# Patient Record
Sex: Female | Born: 1950 | Race: White | Hispanic: No | Smoking: Former smoker
Health system: Southern US, Community
[De-identification: ages and names within clinical notes are randomized; demographics above are authoritative.]

## PROBLEM LIST (undated history)

## (undated) DIAGNOSIS — Z862 Personal history of diseases of the blood and blood-forming organs and certain disorders involving the immune mechanism: Secondary | ICD-10-CM

## (undated) DIAGNOSIS — E782 Mixed hyperlipidemia: Secondary | ICD-10-CM

## (undated) DIAGNOSIS — M542 Cervicalgia: Secondary | ICD-10-CM

## (undated) DIAGNOSIS — I10 Essential (primary) hypertension: Secondary | ICD-10-CM

## (undated) DIAGNOSIS — N393 Stress incontinence (female) (male): Secondary | ICD-10-CM

## (undated) DIAGNOSIS — B001 Herpesviral vesicular dermatitis: Secondary | ICD-10-CM

## (undated) DIAGNOSIS — G8929 Other chronic pain: Secondary | ICD-10-CM

## (undated) DIAGNOSIS — N8110 Cystocele, unspecified: Secondary | ICD-10-CM

## (undated) DIAGNOSIS — M8580 Other specified disorders of bone density and structure, unspecified site: Secondary | ICD-10-CM

## (undated) HISTORY — DX: Stress incontinence (female) (male): N39.3

## (undated) HISTORY — DX: Other specified disorders of bone density and structure, unspecified site: M85.80

## (undated) HISTORY — PX: BLADDER REPAIR: SHX76

## (undated) HISTORY — DX: Mixed hyperlipidemia: E78.2

## (undated) HISTORY — DX: Herpesviral vesicular dermatitis: B00.1

## (undated) HISTORY — DX: Personal history of diseases of the blood and blood-forming organs and certain disorders involving the immune mechanism: Z86.2

## (undated) HISTORY — DX: Essential (primary) hypertension: I10

## (undated) HISTORY — DX: Cervicalgia: M54.2

## (undated) HISTORY — DX: Cystocele, unspecified: N81.10

## (undated) HISTORY — DX: Other chronic pain: G89.29

## (undated) HISTORY — PX: REPAIR OF RECTAL PROLAPSE: SHX6420

---

## 2014-01-07 LAB — HM PAP SMEAR

## 2014-05-30 LAB — HM DEXA SCAN

## 2014-07-29 LAB — HM MAMMOGRAPHY

## 2015-05-22 LAB — COLOGUARD: COLOGUARD: NEGATIVE

## 2015-06-01 LAB — COLOGUARD: Cologuard: NEGATIVE

## 2015-10-23 ENCOUNTER — Encounter: Payer: Self-pay | Admitting: Family Medicine

## 2015-10-23 ENCOUNTER — Ambulatory Visit (INDEPENDENT_AMBULATORY_CARE_PROVIDER_SITE_OTHER): Payer: Medicare HMO | Admitting: Family Medicine

## 2015-10-23 VITALS — BP 124/80 | HR 63 | Ht 62.5 in | Wt 172.8 lb

## 2015-10-23 DIAGNOSIS — I10 Essential (primary) hypertension: Secondary | ICD-10-CM | POA: Diagnosis not present

## 2015-10-23 DIAGNOSIS — N393 Stress incontinence (female) (male): Secondary | ICD-10-CM | POA: Insufficient documentation

## 2015-10-23 DIAGNOSIS — B001 Herpesviral vesicular dermatitis: Secondary | ICD-10-CM | POA: Insufficient documentation

## 2015-10-23 DIAGNOSIS — Z862 Personal history of diseases of the blood and blood-forming organs and certain disorders involving the immune mechanism: Secondary | ICD-10-CM | POA: Insufficient documentation

## 2015-10-23 DIAGNOSIS — Z7689 Persons encountering health services in other specified circumstances: Secondary | ICD-10-CM | POA: Diagnosis not present

## 2015-10-23 LAB — CBC WITH DIFFERENTIAL/PLATELET
BASOS ABS: 85 {cells}/uL (ref 0–200)
Basophils Relative: 1 %
EOS ABS: 85 {cells}/uL (ref 15–500)
Eosinophils Relative: 1 %
HCT: 40.6 % (ref 35.0–45.0)
Hemoglobin: 14.2 g/dL (ref 11.7–15.5)
Lymphocytes Relative: 26 %
Lymphs Abs: 2210 cells/uL (ref 850–3900)
MCH: 30.5 pg (ref 27.0–33.0)
MCHC: 35 g/dL (ref 32.0–36.0)
MCV: 87.3 fL (ref 80.0–100.0)
MONOS PCT: 7 %
MPV: 11.8 fL (ref 7.5–12.5)
Monocytes Absolute: 595 cells/uL (ref 200–950)
NEUTROS ABS: 5525 {cells}/uL (ref 1500–7800)
NEUTROS PCT: 65 %
PLATELETS: 157 10*3/uL (ref 140–400)
RBC: 4.65 MIL/uL (ref 3.80–5.10)
RDW: 12.5 % (ref 11.0–15.0)
WBC: 8.5 10*3/uL (ref 4.0–10.5)

## 2015-10-23 MED ORDER — METOPROLOL TARTRATE 50 MG PO TABS: 50.0000 mg | ORAL_TABLET | Freq: Two times a day (BID) | ORAL | 1 refills | Status: DC

## 2015-10-23 MED ORDER — OXYBUTYNIN CHLORIDE 5 MG PO TABS: 5.0000 mg | ORAL_TABLET | Freq: Two times a day (BID) | ORAL | 1 refills | Status: DC

## 2015-10-23 MED ORDER — VALACYCLOVIR HCL 500 MG PO TABS: 500.0000 mg | ORAL_TABLET | Freq: Every day | ORAL | 1 refills | Status: DC

## 2015-10-23 MED ORDER — HYDROCHLOROTHIAZIDE 25 MG PO TABS: 25.0000 mg | ORAL_TABLET | Freq: Every day | ORAL | 1 refills | Status: DC

## 2015-10-23 NOTE — Patient Instructions (Signed)
We will call you with lab results.       

## 2015-10-23 NOTE — Progress Notes (Signed)
   Subjective:    Patient ID: Cassandra Hendricks, female    DOB: 03/16/1950, 65 y.o.   MRN: [id number]  HPI Chief Complaint  Patient presents with  . new pt    new pt, needs bp meds and oxybutynin   She is new to the practice and here to establish care. Moved here in July.  Previous medical care: in [place]. Dr. Elonda HuskyShannon Holder.  Last CPE: May 2017.   Other providers: none in this area.   Gynecologist in [place] Hematologist for thrombocytopenia. Stable.  Dermatologist Spine and orthopedist specialist- [facility] in [place].  Chiropractor in [place]. Cardiologist - saw at one time but states her heart was fine.  ENT for tinnitus.   Past medical history: HTN- diagnosed many years ago. Genital prolapse-  Surgeries: genital prolapse.   Meds: wheezing? Uses albuterol inhaler daily. No history of asthma.  Clobetasol and premarin-  vaginal dryness and atrophy relafen- as needed for shoulder pain Valacyclovir- cold sores, takes this daily for prevention.   Social history: Lives with alone in a friend's home, is not working currently. 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  Former smoker-1994, denies drinking alcohol, drug use   Immunizations: shingles shot 2017, pneumonia 2017  Health maintenance:  Mammogram: 2017 Colonoscopy: cologuard 2017 negative Last Gynecological Exam: pap smear-May 2007.  [facility] [place]  Reviewed allergies, medications, past medical, surgical, and social history.   Review of Systems Pertinent positives and negatives in the history of present illness.     Objective:   Physical Exam BP 124/80   Pulse 63   Ht 5' 2.5" (1.588 m)   Wt 172 lb 12.8 oz (78.4 kg)   BMI 31.10 kg/m   Alert and in no distress.  Cardiac exam shows a regular sinus rhythm without murmurs or gallops. Lungs are clear to auscultation.      Assessment & Plan:  Essential hypertension - Plan: hydrochlorothiazide (HYDRODIURIL) 25 MG tablet, metoprolol  (LOPRESSOR) 50 MG tablet, Basic metabolic panel  Encounter to establish care  Stress incontinence - Plan: oxybutynin (DITROPAN) 5 MG tablet  Herpes labialis - Plan: valACYclovir (VALTREX) 500 MG tablet  History of thrombocytopenia - Plan: CBC with Differential/Platelet  Discussed that her blood pressure appears to be well managed. Continue on current medication. Medications refilled.  Refilled oxybutynin for what sounds like stress incontinence. Will await her medical records for more information.  Refilled valacyclovir for daily prophylaxis for herpes labialis.  Plan to order labs to evaluate kidney function.  Will await medical records before further treatment.

## 2015-10-24 DIAGNOSIS — R69 Illness, unspecified: Secondary | ICD-10-CM | POA: Diagnosis not present

## 2015-10-24 LAB — BASIC METABOLIC PANEL
BUN: 19 mg/dL (ref 7–25)
CALCIUM: 9.7 mg/dL (ref 8.6–10.4)
CO2: 28 mmol/L (ref 20–31)
CREATININE: 0.59 mg/dL (ref 0.50–0.99)
Chloride: 102 mmol/L (ref 98–110)
GLUCOSE: 85 mg/dL (ref 65–99)
Potassium: 3.7 mmol/L (ref 3.5–5.3)
Sodium: 140 mmol/L (ref 135–146)

## 2015-10-30 ENCOUNTER — Ambulatory Visit: Payer: Self-pay | Admitting: Family Medicine

## 2015-11-06 ENCOUNTER — Telehealth: Payer: Self-pay | Admitting: Family Medicine

## 2015-11-06 NOTE — Telephone Encounter (Signed)
Records received from [facility] [place] and sent back in Vickie's folder

## 2015-11-21 ENCOUNTER — Encounter: Payer: Self-pay | Admitting: Family Medicine

## 2015-11-21 ENCOUNTER — Other Ambulatory Visit: Payer: Self-pay | Admitting: Family Medicine

## 2015-11-21 DIAGNOSIS — E782 Mixed hyperlipidemia: Secondary | ICD-10-CM | POA: Insufficient documentation

## 2015-11-21 DIAGNOSIS — M8580 Other specified disorders of bone density and structure, unspecified site: Secondary | ICD-10-CM | POA: Insufficient documentation

## 2015-11-21 DIAGNOSIS — Z87898 Personal history of other specified conditions: Secondary | ICD-10-CM

## 2015-11-21 DIAGNOSIS — M542 Cervicalgia: Secondary | ICD-10-CM

## 2015-11-21 DIAGNOSIS — G8929 Other chronic pain: Secondary | ICD-10-CM | POA: Insufficient documentation

## 2015-11-21 HISTORY — DX: Personal history of other specified conditions: Z87.898

## 2015-12-28 ENCOUNTER — Encounter: Payer: Self-pay | Admitting: Family Medicine

## 2016-01-23 ENCOUNTER — Encounter: Payer: Self-pay | Admitting: Family Medicine

## 2016-03-11 ENCOUNTER — Telehealth: Payer: Self-pay | Admitting: Family Medicine

## 2016-03-11 MED ORDER — NABUMETONE 500 MG PO TABS: 500.0000 mg | ORAL_TABLET | Freq: Two times a day (BID) | ORAL | 1 refills | Status: DC | PRN

## 2016-03-11 NOTE — Telephone Encounter (Signed)
Pt needs refill nabumetone 500mg  directions on bottle are 1 tab BID #60  to [facility], says she takes prn

## 2016-03-11 NOTE — Telephone Encounter (Signed)
done

## 2016-03-11 NOTE — Telephone Encounter (Signed)
Ok to refill 

## 2016-04-22 ENCOUNTER — Ambulatory Visit (INDEPENDENT_AMBULATORY_CARE_PROVIDER_SITE_OTHER): Payer: Medicare HMO | Admitting: Family Medicine

## 2016-04-22 ENCOUNTER — Encounter: Payer: Self-pay | Admitting: Family Medicine

## 2016-04-22 VITALS — BP 124/78 | HR 63 | Wt 175.2 lb

## 2016-04-22 DIAGNOSIS — I10 Essential (primary) hypertension: Secondary | ICD-10-CM | POA: Diagnosis not present

## 2016-04-22 DIAGNOSIS — N393 Stress incontinence (female) (male): Secondary | ICD-10-CM | POA: Diagnosis not present

## 2016-04-22 MED ORDER — METOPROLOL TARTRATE 50 MG PO TABS: 50.0000 mg | ORAL_TABLET | Freq: Two times a day (BID) | ORAL | 0 refills | Status: DC

## 2016-04-22 MED ORDER — OXYBUTYNIN CHLORIDE 5 MG PO TABS: 5.0000 mg | ORAL_TABLET | Freq: Two times a day (BID) | ORAL | 0 refills | Status: DC

## 2016-04-22 MED ORDER — HYDROCHLOROTHIAZIDE 25 MG PO TABS: 25.0000 mg | ORAL_TABLET | Freq: Every day | ORAL | 0 refills | Status: DC

## 2016-04-22 NOTE — Patient Instructions (Signed)
Call and check with your insurance regarding referrals to specialists including OB/GYN, dentist, eye doctor.   Let's have you return in mid May for a medicare well visit which will include fasting lab checks.

## 2016-04-22 NOTE — Progress Notes (Signed)
   Subjective:    Patient ID: Cassandra Hendricks, female    DOB: 1951/01/05, 66 y.o.   MRN: [id number]  HPI Chief Complaint  Patient presents with  . htn    follow-up on HTN.    She is here for a follow up on HTN. States her BP has been normal at home. No concerns or complaints today. She does need refills on some medications.   States she plans to schedule with an OB/GYN, dentist, and eye doctor. Has not done this since moving her.   History of genital prolapse. States she would like to establish with OB/GYN for this.   Personal history of prediabetes and family history of diabetes.   She had prediabetes in may 2017.    Review of Systems Pertinent positives and negatives in the history of present illness.     Objective:   Physical Exam BP 124/78   Pulse 63   Wt 175 lb 3.2 oz (79.5 kg)   BMI 31.53 kg/m   Alert and oriented and in no acute distress. Not otherwise examined.       Assessment & Plan:  Essential hypertension - Plan: metoprolol (LOPRESSOR) 50 MG tablet, hydrochlorothiazide (HYDRODIURIL) 25 MG tablet  Stress incontinence - Plan: oxybutynin (DITROPAN) 5 MG tablet  Discussed that her blood pressure is well controlled and continue on current medication regimen. Refilled medication per patient request.  Recommend she return in mid May for fasting AWV.

## 2016-05-11 DIAGNOSIS — S70361A Insect bite (nonvenomous), right thigh, initial encounter: Secondary | ICD-10-CM | POA: Diagnosis not present

## 2016-05-13 DIAGNOSIS — S70361D Insect bite (nonvenomous), right thigh, subsequent encounter: Secondary | ICD-10-CM | POA: Diagnosis not present

## 2016-05-20 ENCOUNTER — Encounter: Payer: Self-pay | Admitting: Family Medicine

## 2016-05-20 ENCOUNTER — Ambulatory Visit (INDEPENDENT_AMBULATORY_CARE_PROVIDER_SITE_OTHER): Payer: Medicare HMO | Admitting: Family Medicine

## 2016-05-20 VITALS — BP 120/80 | HR 56 | Ht 62.75 in | Wt 175.2 lb

## 2016-05-20 DIAGNOSIS — Z87898 Personal history of other specified conditions: Secondary | ICD-10-CM | POA: Diagnosis not present

## 2016-05-20 DIAGNOSIS — E782 Mixed hyperlipidemia: Secondary | ICD-10-CM | POA: Diagnosis not present

## 2016-05-20 DIAGNOSIS — N393 Stress incontinence (female) (male): Secondary | ICD-10-CM

## 2016-05-20 DIAGNOSIS — Z862 Personal history of diseases of the blood and blood-forming organs and certain disorders involving the immune mechanism: Secondary | ICD-10-CM

## 2016-05-20 DIAGNOSIS — I10 Essential (primary) hypertension: Secondary | ICD-10-CM | POA: Diagnosis not present

## 2016-05-20 DIAGNOSIS — Z1159 Encounter for screening for other viral diseases: Secondary | ICD-10-CM | POA: Diagnosis not present

## 2016-05-20 DIAGNOSIS — Z0000 Encounter for general adult medical examination without abnormal findings: Secondary | ICD-10-CM

## 2016-05-20 LAB — POCT URINALYSIS DIPSTICK
BILIRUBIN UA: NEGATIVE
Blood, UA: NEGATIVE
GLUCOSE UA: NEGATIVE
KETONES UA: NEGATIVE
LEUKOCYTES UA: NEGATIVE
Nitrite, UA: NEGATIVE
PH UA: 6 (ref 5.0–8.0)
Protein, UA: NEGATIVE
Spec Grav, UA: 1.02 (ref 1.010–1.025)
Urobilinogen, UA: 0.2 E.U./dL

## 2016-05-20 NOTE — Progress Notes (Signed)
Cassandra Hendricks is a 66 y.o. female who presents for annual wellness visit and follow-up on chronic medical conditions.  She has the following concerns:  States she was bitten by a deer tick over a week ago on her left anterior upper thigh. She went to an urgent care and was given a steroid injection and doxycycline. States the area looks much better. No other issues with this.   Last annual physical was in [place] in May 2017.   Immunization History  Administered Date(s) Administered  . Influenza Split 10/24/2015  . Influenza-Unspecified 10/08/2014  . Pneumococcal-Unspecified 12/08/2014  . Tdap 05/11/2016  . Zoster 05/08/2015   Last Pap smear: May 2017 Last mammogram: May 2017 Last colonoscopy: cologuard 2017 - normal per patient  Last DEXA: May 2017. Osteopenia. Taking calcium and vitamin D deficiency.  Dentist: 2017 Ophtho: 2017 Exercise: cleaning houses but nothing extra Saw a retinal specialist in the past year for vision issues- "flashes" at night. States they did not find anything.   States she plans to schedule with an OB/GYN, dentist, and eye doctor. Has not done this since moving her.   History of genital prolapse. States she would like to establish with OB/GYN for this.   Other doctors caring for patient include: none   Depression screen:  See questionnaire below.  Depression screen PHQ 2/9 05/20/2016  Decreased Interest 0  Down, Depressed, Hopeless 0  PHQ - 2 Score 0    Fall Risk Screen: see questionnaire below. Fall Risk  05/20/2016  Falls in the past year? 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 This was discussed and paperwork given.   Review of Systems Constitutional: -fever, -chills, -sweats, -unexpected weight change, -anorexia, -fatigue Allergy: -sneezing, -itching, -congestion Dermatology: denies changing moles, rash, lumps, new worrisome lesions ENT: -runny nose, -ear pain, -sore throat, -hoarseness, -sinus pain, -teeth pain, -tinnitus, + mild hearing loss, -epistaxis Cardiology:  -chest pain, -palpitations, -edema, -orthopnea, -paroxysmal nocturnal dyspnea Respiratory: -cough, -shortness of breath, -dyspnea on exertion, -wheezing, -hemoptysis Gastroenterology: -abdominal pain, -nausea, -vomiting, -diarrhea, -constipation, -blood in stool, -changes in bowel movement Hematology: -bleeding or bruising problems Musculoskeletal: -arthralgias, -myalgias, -joint swelling, -back pain, -neck pain, -cramping, -gait changes Ophthalmology: -vision changes, -eye redness, -itching, -discharge Urology: -dysuria, -difficulty urinating, -hematuria, -urinary frequency, -urgency, +stress incontinence Neurology: -headache, -weakness, -tingling, -numbness, -speech abnormality, -memory loss, -falls, -dizziness Psychology:  -depressed mood, -agitation, -sleep problems    PHYSICAL EXAM:  BP 120/80   Pulse (!) 56   Ht 5' 2.75" (1.594 m)   Wt 175 lb 3.2 oz (79.5 kg)   BMI 31.28 kg/m   General Appearance: Alert, cooperative, no distress, appears stated age Head: Normocephalic, without obvious abnormality, atraumatic Eyes: PERRL, conjunctiva/corneas clear, EOM's intact, fundi benign Ears: Normal TM's and external ear canals Nose: Nares normal, mucosa normal, no drainage or sinus   tenderness Throat: Lips, mucosa, and  tongue normal; teeth and  gums normal Neck: Supple, no lymphadenopathy; thyroid: no enlargement/tenderness/nodules; no carotid bruit or JVD Back: Spine nontender, no curvature, ROM normal, no CVA tenderness Lungs: Clear to auscultation bilaterally without wheezes, rales or ronchi; respirations unlabored Chest Wall: No tenderness or deformity Heart: Regular rate and rhythm, S1 and S2 normal, no murmur, rub or gallop Breast Exam: No tenderness, masses, or nipple discharge or inversion. No axillary lymphadenopathy Abdomen: Soft, non-tender, nondistended, normoactive bowel sounds, no masses, no hepatosplenomegaly Genitalia: declines. Plan to establish with OB/GYN.  Extremities: No clubbing, cyanosis or edema Pulses: 2+ and symmetric all extremities Skin: Skin color, texture, turgor normal, no rashes or lesions Lymph nodes: Cervical, supraclavicular, and axillary nodes normal Neurologic: CNII-XII intact, normal strength, sensation and gait; reflexes 2+ and symmetric throughout Psych: Normal mood, affect, hygiene and grooming.  ASSESSMENT/PLAN: Medicare annual wellness visit, subsequent  Essential hypertension  History of prediabetes - Plan: Hemoglobin A1c  Mixed hyperlipidemia - Plan: Lipid panel  History of thrombocytopenia  Stress incontinence - Plan: POCT urinalysis dipstick  Need for hepatitis C screening test - Plan: Hepatitis C antibody  She is very good about health screenings and prevention. She appears to be doing well and happy to be living in [place] near her children.  BP is within goal.  Prediabetes history- will check a hemoglobin A1c.  Mixed hyperlipidemia- will check fasting lipids.  Counseled on healthy lifestyle with diet and exercise.  Wants to establish with OB/GYN in the next month.  Medication for stress incontinence appears to be working.  She reports being seen for hearing loss and for vision issues in [place]. No worsening complaints.   Discussed that she had  the old shingles vaccine (Zostavax) last May. She will check with her insurance regarding the new Shingrix.  States she has had 2 pneumonia shots. Will attempt to get records.  Counseled on advance directives. She will call or return with questions.  Follow up in 6 months pending labs.    Discussed monthly self breast exams and yearly mammograms; at least 30 minutes of aerobic activity at least 5 days/week and weight-bearing exercise 2x/week; proper sunscreen use reviewed; healthy diet, including goals of calcium and vitamin D intake and alcohol recommendations (less than or equal to 1 drink/day) reviewed; regular seatbelt use; changing batteries in smoke detectors.  Immunization recommendations discussed.  Colonoscopy recommendations reviewed   Medicare Attestation I have personally reviewed: The patient's medical and social history Their use of alcohol, tobacco or illicit drugs Their current medications and supplements The patient's functional ability including ADLs,fall risks, home safety risks, cognitive, and hearing and visual impairment Diet and physical activities Evidence for depression or mood disorders  The patient's weight, height, and BMI have been recorded in the chart.  I have made referrals, counseling, and provided education to the patient based on review of the above and I have provided the patient with a written personalized care plan for preventive services.     Hetty Blend, NP-C   05/20/2016

## 2016-05-20 NOTE — Patient Instructions (Addendum)
Let us know when you find the documentation for your last pneumonia shot.   Bring in the advance directives if you have any questions or when you complete them and we will scan them in to your record.   Make sure you are getting exercise, 150 minutes of physical activity per day.   We will call you with lab results.    GENERAL RECOMMENDATIONS FOR GOOD HEALTH:  Supplements:  . Take a daily baby Aspirin 81mg  at bedtime for heart health unless you have a history of gastrointestinal bleed, allergy to aspirin, or are already taking higher dose Aspirin or other antiplatelet or blood thinner medication.   . Consume 1200 mg of Calcium daily through dietary calcium or supplement if you are female age 67 or older, or men 7571 and older.   Men aged 67-70 should consume 1000 mg of Calcium daily. . Take 600 IU of Vitamin D daily.  Take 800 IU of Calcium daily if you are older than age 67.  . Take a general multivitamin daily.   Healthy diet: Eat a variety of foods, including fruits, vegetables, vegetable protein such as beans, lentils, tofu, and grains, such as rice.  Limit meat or animal protein, but if you eat meat, choose leans cuts such as chicken, fish, or [place].  Drink plenty of water daily.  Decrease saturated fat in the diet, avoid lots of red meat, processed foods, sweets, fast foods, and fried foods.  Limit salt and caffeine intake.  Exercise: Aerobic exercise helps maintain good heart health. Weight bearing exercise helps keep bones and muscles working strong.  We recommend at least 30-40 minutes of exercise most days of the week.   Fall prevention: Falls are the leading cause of injuries, accidents, and accidental deaths in people over the age of 67. Falling is a real threat to your ability to live on your own.  Causes include poor eyesight or poor hearing, illness, poor lighting, throw rugs, clutter in your home, and medication side effects causing dizziness or balance problems.  Such  medications can include medications for depression, sleep problems, high blood pressure, diabetes, and heart conditions.   PREVENTION  Be sure your home is as safe as possible. Here are some tips:  Wear shoes with non-skid soles (not house slippers).   Be sure your home and outside area are well lit.   Use night lights throughout your house, including hallways and stairways.   Remove clutter and clean up spills on floors and walkways.   Remove throw rugs or fasten them to the floor with carpet tape. Tack down carpet edges.   Do not place electrical cords across pathways.   Install grab bars in your bathtub, shower, and toilet area. Towel bars should not be used as a grab bar.   Install handrails on both sides of stairways.   Do not climb on stools or stepladders. Get someone else to help with jobs that require climbing.   Do not wax your floors at all, or use a non-skid wax.   Repair uneven or unsafe sidewalks, walkways or stairs.   Keep frequently used items within reach.   Be aware of pets so you do not trip.  Get regular check-ups from your doctor, and take good care of yourself:  Have your eyes checked every year for vision changes, cataracts, glaucoma, and other eye problems. Wear eyeglasses as directed.   Have your hearing checked every 2 years, or anytime you or others think that you cannot  hear well. Use hearing aids as directed.   See your caregiver if you have foot pain or corns. Sore feet can contribute to falls.   Let your caregiver know if a medicine is making you feel dizzy or making you lose your balance.   Use a cane, walker, or wheelchair as directed. Use walker or wheelchair brakes when getting in and out.   When you get up from bed, sit on the side of the bed for 1 to 2 minutes before you stand up. This will give your blood pressure time to adjust, and you will feel less dizzy.   If you need to go to the bathroom often, consider using a bedside  commode.  Disease prevention:  If you smoke or chew tobacco, find out from your caregiver how to quit. It can literally save your life, no matter how long you have been a tobacco user. If you do not use tobacco, never begin. Medicare does cover some smoking cessation counseling.  Maintain a healthy diet and normal weight. Increased weight leads to problems with blood pressure and diabetes. We check your height, weight, and BMI as part of your yearly visit.  The Body Mass Index or BMI is a way of measuring how much of your body is fat. Having a BMI above 27 increases the risk of heart disease, diabetes, hypertension, stroke and other problems related to obesity. Your caregiver can help determine your BMI and based on it develop an exercise and dietary program to help you achieve or maintain this important measurement at a healthful level.  High blood pressure causes heart and blood vessel problems.  Persistent high blood pressure should be treated with medicine if weight loss and exercise do not work.  We check your blood pressure as part of your yearly visit.  Avoid drinking alcohol in excess (more than two drinks per day).  Avoid use of street drugs. Do not share needles with anyone. Ask for professional help if you need assistance or instructions on stopping the use of alcohol, cigarettes, and/or drugs.  Brush your teeth twice a day with fluoride toothpaste, and floss once a day. Good oral hygiene prevents tooth decay and gum disease. The problems can be painful, unattractive, and can cause other health problems. Visit your dentist for a routine oral and dental checkup and preventive care every 6-12 months.   See your eye doctor yearly for routine screening for things like glaucoma.  Look at your skin regularly.  Use a mirror to look at your back. Notify your caregivers of changes in moles, especially if there are changes in shapes, colors, a size larger than a pencil eraser, an irregular border,  or development of new moles.  Safety:  Use seatbelts 100% of the time, whether driving or as a passenger.  Use safety devices such as hearing protection if you work in environments with loud noise or significant background noise.  Use safety glasses when doing any work that could send debris in to the eyes.  Use a helmet if you ride a bike or motorcycle.  Use appropriate safety gear for contact sports.  Talk to your caregiver about gun safety.  Use sunscreen with a SPF (or skin protection factor) of 15 or greater.  Lighter skinned people are at a greater risk of skin cancer. Don't forget to also wear sunglasses in order to protect your eyes from too much damaging sunlight. Damaging sunlight can accelerate cataract formation.   If you have multiple sexual partners,  or if you are not in a monogamous relationship, practice safe sex. Use condoms. Condoms are used to help reduce the spread of sexually transmitted infections (or STIs).  Consider an HIV test if you have never been tested.  Consider routine screening for STIs if you have multiple sexual partners.   Keep carbon monoxide and smoke detectors in your home functioning at all times. Change the batteries every 6 months or use a model that plugs into the wall or is hard wired in.   END OF LIFE PLANNING/ADVANCED DIRECTIVES Advance health-care planning is deciding the kind of care you want at the end of life. While alert competent adults are able to exercise their rights to make health care and financial decisions, problems arise when an individual becomes unconscious, incapacitated, or otherwise unable to communicate or make such decisions. Advance health care directives are the legal documents in which you give written instructions about your choices limited, aggressive or palliative care if, in the future, you cannot speak for yourself.  Advanced directives include the following: A Health Care Power of Attorney allows you to appoint someone to act  as your health care agent to make health care decisions for you should it be determined by your health care provider that you are no longer able to make these decisions for yourself.  A Living Will is a legal document in which you can declare that under certain conditions you desire your life not be prolonged by extraordinary or artificial means during your last illness or when you are near death. We can provide you with sample advanced directives, you can get an attorney to prepare these for you, or you can visit [place] Secretary of State's website for additional information and resources at [email or web address]  Further, I recommend you have an attorney prepare a Will and Durable Power of Attorney if you haven't done so already.  Please get [place] a copy of your health care Advanced Directives.   PREVENTATIV E CARE RECOMMENDATIONS:  Vaccinations: We recommend the following vaccinations as part of your preventative care:  Pneumococcal vaccine is recommended to protect against certain types of pneumonia.  This is normally recommended for adults age 60 or older once, or up to every 5 years for those at high risk.  The vaccine is also recommended for adults younger than 67 years old with certain underlying conditions that make them high risk for pneumonia.  Influenza vaccine is recommended to protect against seasonal influenza or "the flu." Influenza is a serious disease that can lead to hospitalization and sometimes even death. Traditional flu vaccines (called trivalent vaccines) are made to protect against three flu viruses; an influenza A (H1N1) virus, an influenza A (H3N2) virus, and an influenza B virus. In addition, there are flu vaccines made to protect against four flu viruses (called "quadrivalent" vaccines). These vaccines protect against the same viruses as the trivalent vaccine and an additional B virus.  We recommend the high dose influenza vaccine to those 65 years and  older.  Hepatitis B vaccine to protect against a form of infection of the liver by a virus acquired from blood or body fluids, particularly for high risk groups.  Td or Tdap vaccine to protect against Tetanus, diphtheria and pertussis which can be very serious.  These diseases are caused by bacteria.  Diphtheria and pertussis are spread from person to person through coughing or sneezing.  Tetanus enters the body through cuts, scratches, or wounds.  Tetanus (Lockjaw) causes painful muscle tightening and stiffness,  usually all over the body.  Diphtheria can cause a thick coating to form in the back of the throat.  It can lead to breathing problems, paralysis, heart failure, and death.  Pertussis (Whooping Cough) causes severe coughing spells, which can cause difficulty breathing, vomiting and disturbed sleep.  Td or Tdap is usually given every 10 years.  Shingles vaccine to protect against Varicella Zoster if you are older than age 56, or younger than 67 years old with certain underlying illness.    Cancer Screening: Most routine colon cancer screening begins at the age of 36.  Subsequent colonoscopies are performed either every 5-10 years for normal screening, or every 2-5 years for higher risks patients, up until age 44 years of age. Annual screening is done with easy to use take-home tests to check for hidden blood in the stool called hemoccult tests.  Sigmoidoscopy or colonoscopy can detect the earliest forms of colon cancer and is life saving. These tests use a small camera at the end of a tube to directly examine the colon.   Pelvic Exam and Pap Smear: Pelvic exams and pap smears are performed routinely to evaluate for abnormalities as well as cancers including cervical and vaginal cancers.  This is generally performed every 2-3 years for most women, or more frequently for higher risk patients.  Mammograms: Mammograms are used to screen for breast cancer.  Medicare covers baseline screening once  from ages 44-83 years old, but will cover mammograms yearly for those 40 years and older.  In accordance with other guidelines, you may not need a mammogram every year though.  The decision on how frequently you need a mammogram should be discussed with you medical provider.    Osteoporosis Screening: Screening for osteoporosis usually begins at age 3 for women, and can be done as frequent as every 2 years.  However, women or men with higher risk of osteoporosis may be screened earlier than age 27.  Osteoporosis or low bone mass is diminished bone strength from alterations in bone architecture leading to bone fragility and increased fracture risk.     Cardiovascular Screening: Fat and cholesterol leaves deposits in your arteries that can block them. This causes heart disease and vessel disease elsewhere in your body.  If your cholesterol is found to be high, or if you have heart disease or certain other medical conditions, then you may need to have your cholesterol monitored frequently and be treated with medication. Cardiovascular screening in the form of lab tests for cholesterol, HDL and triglycerides can be done every 5 years.  A screening electrocardiogram can be done as part of the Welcome to Medicare physical.  Diabetes Screening: Diabetes screening can be done at least every 3 years for those with risk factors,  or every 6-72months for prediabetic patients.  Screening includes fasting blood sugar test or glucose tolerance test.  Risk factors include hypertension, dyslipidemia, obesity, previously abnormal glucose tests, family history of diabetes, age 25 years or older, and history of gestations diabetes.   AAA (abdominal aortic aneurysm) Screening: Medicare allows for a one time ultrasound to screen for abdominal aortic aneurysm if done as a referral as part of the Welcome to Medicare exam.  Men eligible for this screening include those men between age 67-51 years of age who have smoked at  least 100 cigarettes in his lifetime and/or has a family history of AAA.  HIV Screening:  Medicare allows for yearly screening for patients at high risk for contracting HIV disease.

## 2016-05-21 LAB — LIPID PANEL
CHOL/HDL RATIO: 3.1 ratio (ref <5.0)
Cholesterol: 191 mg/dL (ref <200)
HDL: 62 mg/dL (ref >50)
LDL Cholesterol: 116 mg/dL — ABNORMAL HIGH (ref <100)
TRIGLYCERIDES: 65 mg/dL (ref <150)
VLDL: 13 mg/dL (ref <30)

## 2016-05-21 LAB — HEPATITIS C ANTIBODY: HCV Ab: NEGATIVE

## 2016-05-21 LAB — HEMOGLOBIN A1C
Hgb A1c MFr Bld: 5.8 % — ABNORMAL HIGH (ref <5.7)
Mean Plasma Glucose: 120 mg/dL

## 2016-06-17 ENCOUNTER — Telehealth: Payer: Self-pay | Admitting: Family Medicine

## 2016-06-17 DIAGNOSIS — B001 Herpesviral vesicular dermatitis: Secondary | ICD-10-CM

## 2016-06-17 MED ORDER — CLOBETASOL PROPIONATE 0.05 % EX SOLN
1.0000 | Freq: Two times a day (BID) | CUTANEOUS | 0 refills | Status: DC
Start: 2016-06-17 — End: 2016-10-14

## 2016-06-17 MED ORDER — VALACYCLOVIR HCL 500 MG PO TABS: 500.0000 mg | ORAL_TABLET | Freq: Every day | ORAL | 1 refills | Status: DC

## 2016-06-17 NOTE — Telephone Encounter (Signed)
Done. Patient is in the process of finding a dermatologist

## 2016-06-17 NOTE — Telephone Encounter (Signed)
Ok to refill valacyclovir. Please ask what she is using Clobetasol for and how often she uses it. I have never prescribed this for her as far as I can tell.

## 2016-06-17 NOTE — Telephone Encounter (Signed)
Pt requesting refill on Valacyclovir 500 mg and Clobetasol 0.05 %

## 2016-06-17 NOTE — Telephone Encounter (Signed)
Ok to give her one refill and then we will have her follow up with a dermatologist.

## 2016-06-17 NOTE — Telephone Encounter (Signed)
Spoke to patient and she states she uses the clobetasol liquid for her scalp for scalp itch. She uses it often as she needs too. She used to see a dermatology but having trouble finding a dermatology to take medicaid and medicare here so she is still looking. 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

## 2016-07-29 ENCOUNTER — Telehealth: Payer: Self-pay | Admitting: Internal Medicine

## 2016-07-29 ENCOUNTER — Telehealth: Payer: Self-pay | Admitting: Family Medicine

## 2016-07-29 DIAGNOSIS — I10 Essential (primary) hypertension: Secondary | ICD-10-CM

## 2016-07-29 DIAGNOSIS — N393 Stress incontinence (female) (male): Secondary | ICD-10-CM

## 2016-07-29 MED ORDER — METOPROLOL TARTRATE 50 MG PO TABS: 50.0000 mg | ORAL_TABLET | Freq: Two times a day (BID) | ORAL | 1 refills | Status: DC

## 2016-07-29 MED ORDER — OXYBUTYNIN CHLORIDE 5 MG PO TABS: 5.0000 mg | ORAL_TABLET | Freq: Two times a day (BID) | ORAL | 0 refills | Status: DC

## 2016-07-29 MED ORDER — HYDROCHLOROTHIAZIDE 25 MG PO TABS: 25.0000 mg | ORAL_TABLET | Freq: Every day | ORAL | 1 refills | Status: DC

## 2016-07-29 NOTE — Telephone Encounter (Signed)
Pt left a voicemail message requesting refill on Oxybutynin 5 mg

## 2016-07-29 NOTE — Telephone Encounter (Signed)
Pt called and needed refills on bp and hctz

## 2016-10-04 ENCOUNTER — Encounter: Payer: Self-pay | Admitting: Obstetrics & Gynecology

## 2016-10-04 ENCOUNTER — Ambulatory Visit (INDEPENDENT_AMBULATORY_CARE_PROVIDER_SITE_OTHER): Payer: Medicare Other | Admitting: Obstetrics & Gynecology

## 2016-10-04 VITALS — BP 145/73 | HR 54 | Ht 64.0 in | Wt 181.2 lb

## 2016-10-04 DIAGNOSIS — M8580 Other specified disorders of bone density and structure, unspecified site: Secondary | ICD-10-CM

## 2016-10-04 DIAGNOSIS — Z01419 Encounter for gynecological examination (general) (routine) without abnormal findings: Secondary | ICD-10-CM

## 2016-10-04 DIAGNOSIS — N393 Stress incontinence (female) (male): Secondary | ICD-10-CM

## 2016-10-04 DIAGNOSIS — Z23 Encounter for immunization: Secondary | ICD-10-CM | POA: Diagnosis not present

## 2016-10-04 DIAGNOSIS — Z0000 Encounter for general adult medical examination without abnormal findings: Secondary | ICD-10-CM

## 2016-10-04 NOTE — Progress Notes (Signed)
   Subjective:    Patient ID: Cassandra Hendricks, female    DOB: April 26, 1950, 66 y.o.   MRN: [id number]  HPI 66 yo DW P2 (48 and 4 yo kids, 2 grands) here to establish care.    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 I have rec'd a urology consult to address these issues. She would like to defer this at the present. She will stop taking the ditropan since it is for urge incontinence. If urge incontinence then shows up, she will go to the urologist. Osteopenia- check Vitamin d  She has vaginal estrogen cream at home but hasn't tried it yet. She will try this 2 times per week.  I have discussed Pap smear recs (ie. She doesn't need one)  Schedule mammogram, flu vaccine today

## 2016-10-05 LAB — VITAMIN D 25 HYDROXY (VIT D DEFICIENCY, FRACTURES): VIT D 25 HYDROXY: 40.5 ng/mL (ref 30.0–100.0)

## 2016-10-12 LAB — VITAMIN D 1,25 DIHYDROXY

## 2016-10-14 ENCOUNTER — Telehealth: Payer: Self-pay | Admitting: Family Medicine

## 2016-10-14 ENCOUNTER — Other Ambulatory Visit: Payer: Self-pay | Admitting: Family Medicine

## 2016-10-14 NOTE — Telephone Encounter (Signed)
Please take care of this.  

## 2016-10-14 NOTE — Telephone Encounter (Signed)
[facility] req Clobetasol .05% sol

## 2016-10-14 NOTE — Telephone Encounter (Signed)
Pt has an appointment on December 28th @ 9:00am with Dr. Sherlean Foot at [facility]. Is it okay to refill this med until her appoint with Dermatology

## 2016-10-14 NOTE — Telephone Encounter (Signed)
ok 

## 2016-10-14 NOTE — Telephone Encounter (Signed)
This was refilled already today.

## 2016-10-25 ENCOUNTER — Telehealth: Payer: Self-pay

## 2016-10-25 NOTE — Telephone Encounter (Signed)
Returned call to patient, she wanted the results of her Vitamin D.

## 2016-11-05 ENCOUNTER — Telehealth: Payer: Self-pay | Admitting: Pediatrics

## 2016-11-05 NOTE — Telephone Encounter (Signed)
Pt cb. I advised recommendations. She voiced understanding and agreed with plan.

## 2016-11-05 NOTE — Telephone Encounter (Signed)
Pt called requesting call back in regards to Dr.Dove's follow up appointment recommendation.  Per 09/28 Note RTO 10/04/17 for AEX.  LMTCB

## 2016-11-25 ENCOUNTER — Ambulatory Visit: Payer: Medicare HMO | Admitting: Family Medicine

## 2016-12-02 ENCOUNTER — Ambulatory Visit: Payer: Medicare HMO | Admitting: Family Medicine

## 2016-12-11 ENCOUNTER — Other Ambulatory Visit: Payer: Self-pay | Admitting: Family Medicine

## 2016-12-11 DIAGNOSIS — B001 Herpesviral vesicular dermatitis: Secondary | ICD-10-CM

## 2016-12-23 ENCOUNTER — Encounter: Payer: Self-pay | Admitting: Family Medicine

## 2016-12-23 ENCOUNTER — Ambulatory Visit (INDEPENDENT_AMBULATORY_CARE_PROVIDER_SITE_OTHER): Payer: Medicare Other | Admitting: Family Medicine

## 2016-12-23 VITALS — BP 130/86 | HR 80 | Ht 64.0 in | Wt 179.0 lb

## 2016-12-23 DIAGNOSIS — Z8739 Personal history of other diseases of the musculoskeletal system and connective tissue: Secondary | ICD-10-CM

## 2016-12-23 DIAGNOSIS — I10 Essential (primary) hypertension: Secondary | ICD-10-CM | POA: Diagnosis not present

## 2016-12-23 DIAGNOSIS — R7309 Other abnormal glucose: Secondary | ICD-10-CM | POA: Diagnosis not present

## 2016-12-23 DIAGNOSIS — B001 Herpesviral vesicular dermatitis: Secondary | ICD-10-CM | POA: Diagnosis not present

## 2016-12-23 NOTE — Progress Notes (Signed)
   Subjective:    Patient ID: Cassandra Hendricks, female    DOB: 09/16/1950, 66 y.o.   MRN: [id number]  HPI Chief Complaint  Patient presents with  . Hypertension    2 month follow up.    She is here for a follow up on HTN and prediabetes.  Last A1c 5.8.   Taking metoprolol and HCTZ daily for HTN. Does not check BP outside of here.   No longer taking oxybutynin, states her OB/GYN took her off this medication. She is doing fine.   Taking Valtrex daily for cold sores. States she used to get them often and has not had one since being on the medication. She does not want to stop taking this.   Diet is fairly high in carbohydrates. Does not exercise but is active, cleans houses for work.  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 She has been taking ibuprofen 800 mg some days for this. States she was under the care of an orthopedist in the past but has not established with one locally. States she has arthritis in her hands and has had shoulder injections for arthritis in the past.   Denies fever, chills, dizziness, chest pain, palpitations, shortness of breath, abdominal pain, N/V/D, urinary symptoms, LE edema.    Reviewed allergies, medications, past medical, surgical, family, and social history.   Review of Systems Pertinent positives and negatives in the history of present illness.     Objective:   Physical Exam BP 130/86   Pulse 80   Ht 5\' 4"  (1.626 m)   Wt 179 lb (81.2 kg)   BMI 30.73 kg/m  Alert and in no distress.  Pharyngeal area is normal. Neck is supple without adenopathy or thyromegaly. Cardiac exam shows a regular sinus rhythm without murmurs or gallops. Lungs are clear to auscultation. Extremities without edema.       Assessment & Plan:  Essential hypertension  Elevated hemoglobin A1c  Herpes labialis  History of arthritis  Doing fine on current medications. No side effects.  She will continue medication regimen and let me know if her BP is not in goal range.  Discussed low sodium diet.  DASH diet handout given.  Follow up in May for AWV and fasting CPE.

## 2016-12-23 NOTE — Patient Instructions (Addendum)
Keep an eye on your blood pressure and let me know if your readings are not consistently less than 130/80. Your BP today is 130/86.   If your hip pain continues then you can come back or see an orthopedist as discussed.   Follow up fasting (nothing to eat or drink except water) in late May for your Medicare annual wellness visit.   Watch your sodium intake.    DASH Eating Plan DASH stands for "Dietary Approaches to Stop Hypertension." The DASH eating plan is a healthy eating plan that has been shown to reduce high blood pressure (hypertension). It may also reduce your risk for type 2 diabetes, heart disease, and stroke. The DASH eating plan may also help with weight loss. What are tips for following this plan? General guidelines  Avoid eating more than 2,300 mg (milligrams) of salt (sodium) a day. If you have hypertension, you may need to reduce your sodium intake to 1,500 mg a day.  Limit alcohol intake to no more than 1 drink a day for nonpregnant women and 2 drinks a day for men. One drink equals 12 oz of beer, 5 oz of wine, or 1 oz of hard liquor.  Work with your health care provider to maintain a healthy body weight or to lose weight. Ask what an ideal weight is for you.  Get at least 30 minutes of exercise that causes your heart to beat faster (aerobic exercise) most days of the week. Activities may include walking, swimming, or biking.  Work with your health care provider or diet and nutrition specialist (dietitian) to adjust your eating plan to your individual calorie needs. Reading food labels  Check food labels for the amount of sodium per serving. Choose foods with less than 5 percent of the Daily Value of sodium. Generally, foods with less than 300 mg of sodium per serving fit into this eating plan.  To find whole grains, look for the word "whole" as the first word in the ingredient list. Shopping  Buy products labeled as "low-sodium" or "no salt added."  Buy fresh foods.  Avoid canned foods and premade or frozen meals. Cooking  Avoid adding salt when cooking. Use salt-free seasonings or herbs instead of table salt or sea salt. Check with your health care provider or pharmacist before using salt substitutes.  Do not fry foods. Cook foods using healthy methods such as baking, boiling, grilling, and broiling instead.  Cook with heart-healthy oils, such as olive, canola, soybean, or sunflower oil. Meal planning   Eat a balanced diet that includes: ? 5 or more servings of fruits and vegetables each day. At each meal, try to fill half of your plate with fruits and vegetables. ? Up to 6-8 servings of whole grains each day. ? Less than 6 oz of lean meat, poultry, or fish each day. A 3-oz serving of meat is about the same size as a deck of cards. One egg equals 1 oz. ? 2 servings of low-fat dairy each day. ? A serving of nuts, seeds, or beans 5 times each week. ? Heart-healthy fats. Healthy fats called Omega-3 fatty acids are found in foods such as flaxseeds and coldwater fish, like sardines, salmon, and mackerel.  Limit how much you eat of the following: ? Canned or prepackaged foods. ? Food that is high in trans fat, such as fried foods. ? Food that is high in saturated fat, such as fatty meat. ? Sweets, desserts, sugary drinks, and other foods with added sugar. ?  Full-fat dairy products.  Do not salt foods before eating.  Try to eat at least 2 vegetarian meals each week.  Eat more home-cooked food and less restaurant, buffet, and fast food.  When eating at a restaurant, ask that your food be prepared with less salt or no salt, if possible. What foods are recommended? The items listed may not be a complete list. Talk with your dietitian about what dietary choices are best for you. Grains Whole-grain or whole-wheat bread. Whole-grain or whole-wheat pasta. Brown rice. [place]. Bulgur. Whole-grain and low-sodium cereals. Pita bread. Low-fat,  low-sodium crackers. Whole-wheat flour tortillas. Vegetables Fresh or frozen vegetables (raw, steamed, roasted, or grilled). Low-sodium or reduced-sodium tomato and vegetable juice. Low-sodium or reduced-sodium tomato sauce and tomato paste. Low-sodium or reduced-sodium canned vegetables. Fruits All fresh, dried, or frozen fruit. Canned fruit in natural juice (without added sugar). Meat and other protein foods Skinless chicken or [place]. Ground chicken or [place]. Pork with fat trimmed off. Fish and seafood. Egg whites. Dried beans, peas, or lentils. Unsalted nuts, nut butters, and seeds. Unsalted canned beans. Lean cuts of beef with fat trimmed off. Low-sodium, lean deli meat. Dairy Low-fat (1%) or fat-free (skim) milk. Fat-free, low-fat, or reduced-fat cheeses. Nonfat, low-sodium ricotta or cottage cheese. Low-fat or nonfat yogurt. Low-fat, low-sodium cheese. Fats and oils Soft margarine without trans fats. Vegetable oil. Low-fat, reduced-fat, or light mayonnaise and salad dressings (reduced-sodium). Canola, safflower, olive, soybean, and sunflower oils. Avocado. Seasoning and other foods Herbs. Spices. Seasoning mixes without salt. Unsalted popcorn and pretzels. Fat-free sweets. What foods are not recommended? The items listed may not be a complete list. Talk with your dietitian about what dietary choices are best for you. Grains Baked goods made with fat, such as croissants, muffins, or some breads. Dry pasta or rice meal packs. Vegetables Creamed or fried vegetables. Vegetables in a cheese sauce. Regular canned vegetables (not low-sodium or reduced-sodium). Regular canned tomato sauce and paste (not low-sodium or reduced-sodium). Regular tomato and vegetable juice (not low-sodium or reduced-sodium). Angie Fava. Olives. Fruits Canned fruit in a light or heavy syrup. Fried fruit. Fruit in cream or butter sauce. Meat and other protein foods Fatty cuts of meat. Ribs. Fried meat. Berniece Salines. Sausage.  Bologna and other processed lunch meats. Salami. Fatback. Hotdogs. Bratwurst. Salted nuts and seeds. Canned beans with added salt. Canned or smoked fish. Whole eggs or egg yolks. Chicken or [place] with skin. Dairy Whole or 2% milk, cream, and half-and-half. Whole or full-fat cream cheese. Whole-fat or sweetened yogurt. Full-fat cheese. Nondairy creamers. Whipped toppings. Processed cheese and cheese spreads. Fats and oils Butter. Stick margarine. Lard. Shortening. Ghee. Bacon fat. Tropical oils, such as coconut, palm kernel, or palm oil. Seasoning and other foods Salted popcorn and pretzels. Onion salt, garlic salt, seasoned salt, table salt, and sea salt. Worcestershire sauce. Tartar sauce. Barbecue sauce. Teriyaki sauce. Soy sauce, including reduced-sodium. Steak sauce. Canned and packaged gravies. Fish sauce. Oyster sauce. Cocktail sauce. Horseradish that you find on the shelf. Ketchup. Mustard. Meat flavorings and tenderizers. Bouillon cubes. Hot sauce and Tabasco sauce. Premade or packaged marinades. Premade or packaged taco seasonings. Relishes. Regular salad dressings. Where to find more information:  National Heart, Lung, and [place]: https://wilson-eaton.com/  American Heart Association: www.heart.org Summary  The DASH eating plan is a healthy eating plan that has been shown to reduce high blood pressure (hypertension). It may also reduce your risk for type 2 diabetes, heart disease, and stroke.  With the DASH eating plan, you  should limit salt (sodium) intake to 2,300 mg a day. If you have hypertension, you may need to reduce your sodium intake to 1,500 mg a day.  When on the DASH eating plan, aim to eat more fresh fruits and vegetables, whole grains, lean proteins, low-fat dairy, and heart-healthy fats.  Work with your health care provider or diet and nutrition specialist (dietitian) to adjust your eating plan to your individual calorie needs. This information is not intended to  replace advice given to you by your health care provider. Make sure you discuss any questions you have with your health care provider. Document Released: 12/13/2010 Document Revised: 12/18/2015 Document Reviewed: 12/18/2015 Elsevier Interactive Patient Education  2017 [employer].

## 2017-02-10 ENCOUNTER — Other Ambulatory Visit: Payer: Self-pay | Admitting: Family Medicine

## 2017-02-10 DIAGNOSIS — I10 Essential (primary) hypertension: Secondary | ICD-10-CM

## 2017-06-16 ENCOUNTER — Other Ambulatory Visit: Payer: Self-pay | Admitting: Family Medicine

## 2017-06-16 DIAGNOSIS — B001 Herpesviral vesicular dermatitis: Secondary | ICD-10-CM

## 2017-06-16 NOTE — Telephone Encounter (Signed)
Is this okay to refill? 

## 2017-06-16 NOTE — Telephone Encounter (Signed)
Pt scheduled an MEd check plus on June 25th

## 2017-06-16 NOTE — Telephone Encounter (Signed)
Ok to refill for 90 days if she has an upcoming med well visit. Looks like she is overdue.

## 2017-07-01 ENCOUNTER — Encounter: Payer: Self-pay | Admitting: Family Medicine

## 2017-07-01 ENCOUNTER — Ambulatory Visit (INDEPENDENT_AMBULATORY_CARE_PROVIDER_SITE_OTHER): Payer: Medicare Other | Admitting: Family Medicine

## 2017-07-01 VITALS — BP 130/74 | HR 67 | Temp 98.1°F | Ht 62.75 in | Wt 185.4 lb

## 2017-07-01 DIAGNOSIS — E782 Mixed hyperlipidemia: Secondary | ICD-10-CM | POA: Diagnosis not present

## 2017-07-01 DIAGNOSIS — Z0000 Encounter for general adult medical examination without abnormal findings: Secondary | ICD-10-CM

## 2017-07-01 DIAGNOSIS — Z7189 Other specified counseling: Secondary | ICD-10-CM | POA: Diagnosis not present

## 2017-07-01 DIAGNOSIS — E559 Vitamin D deficiency, unspecified: Secondary | ICD-10-CM

## 2017-07-01 DIAGNOSIS — I10 Essential (primary) hypertension: Secondary | ICD-10-CM

## 2017-07-01 DIAGNOSIS — Z7185 Encounter for immunization safety counseling: Secondary | ICD-10-CM

## 2017-07-01 DIAGNOSIS — Z79899 Other long term (current) drug therapy: Secondary | ICD-10-CM | POA: Diagnosis not present

## 2017-07-01 DIAGNOSIS — R7303 Prediabetes: Secondary | ICD-10-CM

## 2017-07-01 DIAGNOSIS — Z23 Encounter for immunization: Secondary | ICD-10-CM | POA: Diagnosis not present

## 2017-07-01 DIAGNOSIS — Z8349 Family history of other endocrine, nutritional and metabolic diseases: Secondary | ICD-10-CM | POA: Diagnosis not present

## 2017-07-01 DIAGNOSIS — Z833 Family history of diabetes mellitus: Secondary | ICD-10-CM | POA: Diagnosis not present

## 2017-07-01 DIAGNOSIS — N393 Stress incontinence (female) (male): Secondary | ICD-10-CM

## 2017-07-01 DIAGNOSIS — M8580 Other specified disorders of bone density and structure, unspecified site: Secondary | ICD-10-CM

## 2017-07-01 NOTE — Progress Notes (Signed)
Cassandra Hendricks is a 67 y.o. female who presents for annual wellness visit and follow-up on chronic medical conditions.  She has the following concerns:  No longer having concerns with urinary incontinence. Not on medication.   Prediabetes history and needs this checked.  Hyperlipidemia in past. Not on statin.  Vitamin D deficiency- taking vitamin D supplement.   States she saw a cardiologist 20 years ago or so. Had a negative stress test. Started on aspirin therapy then. States her skin is easily bruised and questions whether she should continue taking this. Denies history of cardiac event. No diabetes.    Immunization History  Administered Date(s) Administered  . Influenza Split 10/24/2015  . Influenza,inj,Quad PF,6+ Mos 10/04/2016  . Influenza-Unspecified 10/08/2014  . Pneumococcal Conjugate-13 07/01/2017  . Pneumococcal-Unspecified 12/08/2014  . Tdap 05/11/2016  . Zoster 05/08/2015   Last Pap smear: OB/GYN- Dr. Marice Potter. Due in October  Last mammogram: 2017 ([facility]) appointment with Dr. Marice Potter in October Last colonoscopy:2017 (Cologuard) Last DEXA: due in 2020 Dentist:Dr. Victoriano Lain Ophtho: last year  Exercise: none. Signed up for silver sneakers.   Other doctors caring for patient include: Orthopedist- Dr. Charlett Blake  Depression screen:  See questionnaire below.  Depression screen [facility] 2/9 07/01/2017 05/20/2016  Decreased Interest 0 0  Down, Depressed, Hopeless 0 0  PHQ - 2 Score 0 0    Fall Risk Screen: see questionnaire below. Fall Risk  07/01/2017 05/20/2016  Falls in the past year? No No    ADL screen:  See questionnaire below Functional Status Survey: Is the patient deaf or have difficulty hearing?: No Does the patient have difficulty seeing, even when wearing glasses/contacts?: No Does the patient have difficulty concentrating, remembering, or making decisions?: No Does the patient have difficulty walking or climbing stairs?: Yes(knee pain going up  stairs) Does the patient have difficulty dressing or bathing?: No Does the patient have difficulty doing errands alone such as visiting a doctor's office or shopping?: No   End of Life Discussion:  Patient does not have a living will and medical power of attorney. Paperwork given and discussed.   Review of Systems Constitutional: -fever, -chills, -sweats, -unexpected weight change, -anorexia, -fatigue Allergy: -sneezing, -itching, -congestion Dermatology: denies changing moles, rash, lumps, new worrisome lesions ENT: -runny nose, -ear pain, -sore throat, -hoarseness, -sinus pain, -teeth pain, -tinnitus, -hearing loss, -epistaxis Cardiology:  -chest pain, -palpitations, -edema, -orthopnea, -paroxysmal nocturnal dyspnea Respiratory: -cough, -shortness of breath, -dyspnea on exertion, -wheezing, -hemoptysis Gastroenterology: -abdominal pain, -nausea, -vomiting, -diarrhea, -constipation, -blood in stool, -changes in bowel movement, -dysphagia Hematology: -bleeding, easily bruises  Musculoskeletal: -arthralgias, -myalgias, -joint swelling, -back pain, -neck pain, -cramping, -gait changes Ophthalmology: -vision changes, -eye redness, -itching, -discharge Urology: -dysuria, -difficulty urinating, -hematuria, -urinary frequency, -urgency, incontinence Neurology: -headache, -weakness, -tingling, -numbness, -speech abnormality, -memory loss, -falls, -dizziness Psychology:  -depressed mood, -agitation, -sleep problems    PHYSICAL EXAM:  BP 130/74   Pulse 67   Temp 98.1 F (36.7 C) (Oral)   Ht 5' 2.75" (1.594 m)   Wt 185 lb 6.4 oz (84.1 kg)   SpO2 95%   BMI 33.10 kg/m   General Appearance: Alert, cooperative, no distress, appears stated age Head: Normocephalic, without obvious abnormality, atraumatic Eyes: PERRL, conjunctiva/corneas clear, EOM's intact, fundi benign Ears: Normal TM's and external ear canals Nose: Nares normal, mucosa normal, no drainage or sinus   tenderness Throat:  Lips, mucosa, and tongue normal; teeth and gums normal Neck: Supple, no lymphadenopathy; thyroid: no enlargement/tenderness/nodules; no carotid bruit or  JVD Back: Spine nontender, no curvature, ROM normal, no CVA tenderness Lungs: Clear to auscultation bilaterally without wheezes, rales or ronchi; respirations unlabored Chest Wall: No tenderness or deformity Heart: Regular rate and rhythm, S1 and S2 normal, no murmur, rub or gallop Breast Exam: OB/GYN Abdomen: Soft, non-tender, nondistended, normoactive bowel sounds, no masses, no hepatosplenomegaly Genitalia: OB/GYN Extremities: No clubbing, cyanosis or edema Pulses: 2+ and symmetric all extremities Skin: Skin color, texture, turgor normal, no rashes or lesions Lymph nodes: Cervical, supraclavicular, and axillary nodes normal Neurologic: CNII-XII intact, normal strength, sensation and gait; reflexes 2+ and symmetric throughout Psych: Normal mood, affect, hygiene and grooming.  ASSESSMENT/PLAN: Medicare annual wellness visit, subsequent  Essential hypertension - Plan: CBC with Differential/Platelet, Comprehensive metabolic panel  Prediabetes - Plan: Hemoglobin A1c  Family history of diabetes mellitus in first degree relative - Plan: Hemoglobin A1c  Stress incontinence  Osteopenia determined by x-ray  Mixed hyperlipidemia  Need for vaccination against Streptococcus pneumoniae  Family history of thyroid disease in mother - Plan: TSH  Vitamin D deficiency - Plan: VITAMIN D 25 Hydroxy (Vit-D Deficiency, Fractures)  Medication management - Plan: VITAMIN D 25 Hydroxy (Vit-D Deficiency, Fractures)  Advance directive discussed with patient  Vaccine counseling  She appears to be doing quite well.  Her mood is stable. Blood pressure is within goal range.  Continue on current medication.  Continue with healthy diet.  She is not exercising but plans to start.  She did sign up for Silver sneakers.  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 Counseling on immunizations.  Prevnar 13 given today.  She has now had both pneumonia vaccines. She has had the old shingles vaccine and we discussed the new Shingrix vaccine.  She will let me know if she decides to get this. She will follow-up with her OB/GYN for repeat mammogram, Pap smear and bone density this fall. Advanced directive counseling done.  She does have the paperwork and will discuss this with her daughter.  She will let me know if she has any questions. No longer having any incontinence, Will check labs and follow-up   Discussed monthly self breast exams and yearly mammograms; at least 30 minutes of aerobic activity at least 5 days/week and weight-bearing exercise 2x/week; proper sunscreen use reviewed; healthy diet, including goals of calcium and vitamin D intake and alcohol recommendations (less than or equal to 1 drink/day) reviewed; regular seatbelt use; changing batteries in smoke detectors.  Immunization recommendations discussed.  Colonoscopy recommendations reviewed   Medicare Attestation I have personally reviewed: The patient's medical and social history Their use of alcohol, tobacco or illicit drugs Their current medications and supplements The patient's functional ability including ADLs,fall risks, home safety risks, cognitive, and hearing and visual impairment Diet and physical activities Evidence for depression or mood disorders  The patient's weight, height, and BMI have been recorded in the chart.  I have made referrals, counseling, and provided education to the patient based on review of the above and I have provided the patient with a written personalized care plan for preventive services.     Hetty BlendVickie Melaney Tellefsen, NP-C   07/01/2017

## 2017-07-02 LAB — HEMOGLOBIN A1C
ESTIMATED AVERAGE GLUCOSE: 128 mg/dL
Hgb A1c MFr Bld: 6.1 % — ABNORMAL HIGH (ref 4.8–5.6)

## 2017-07-02 LAB — CBC WITH DIFFERENTIAL/PLATELET
BASOS ABS: 0 10*3/uL (ref 0.0–0.2)
Basos: 1 %
EOS (ABSOLUTE): 0.2 10*3/uL (ref 0.0–0.4)
Eos: 3 %
HEMOGLOBIN: 13.8 g/dL (ref 11.1–15.9)
Hematocrit: 39 % (ref 34.0–46.6)
Immature Grans (Abs): 0 10*3/uL (ref 0.0–0.1)
Immature Granulocytes: 0 %
LYMPHS: 35 %
Lymphocytes Absolute: 2.5 10*3/uL (ref 0.7–3.1)
MCH: 30.9 pg (ref 26.6–33.0)
MCHC: 35.4 g/dL (ref 31.5–35.7)
MCV: 87 fL (ref 79–97)
MONOCYTES: 8 %
Monocytes Absolute: 0.6 10*3/uL (ref 0.1–0.9)
NEUTROS PCT: 53 %
Neutrophils Absolute: 3.9 10*3/uL (ref 1.4–7.0)
PLATELETS: 155 10*3/uL (ref 150–450)
RBC: 4.47 x10E6/uL (ref 3.77–5.28)
RDW: 12.8 % (ref 12.3–15.4)
WBC: 7.3 10*3/uL (ref 3.4–10.8)

## 2017-07-02 LAB — COMPREHENSIVE METABOLIC PANEL
ALBUMIN: 4.4 g/dL (ref 3.6–4.8)
ALK PHOS: 58 IU/L (ref 39–117)
ALT: 31 IU/L (ref 0–32)
AST: 29 IU/L (ref 0–40)
Albumin/Globulin Ratio: 2.1 (ref 1.2–2.2)
BILIRUBIN TOTAL: 0.5 mg/dL (ref 0.0–1.2)
BUN / CREAT RATIO: 28 (ref 12–28)
BUN: 21 mg/dL (ref 8–27)
CO2: 25 mmol/L (ref 20–29)
CREATININE: 0.74 mg/dL (ref 0.57–1.00)
Calcium: 10 mg/dL (ref 8.7–10.3)
Chloride: 102 mmol/L (ref 96–106)
GFR calc Af Amer: 96 mL/min/{1.73_m2} (ref >59)
GFR calc non Af Amer: 84 mL/min/{1.73_m2} (ref >59)
GLUCOSE: 101 mg/dL — AB (ref 65–99)
Globulin, Total: 2.1 g/dL (ref 1.5–4.5)
Potassium: 4 mmol/L (ref 3.5–5.2)
Sodium: 142 mmol/L (ref 134–144)
TOTAL PROTEIN: 6.5 g/dL (ref 6.0–8.5)

## 2017-07-02 LAB — TSH: TSH: 1.49 u[IU]/mL (ref 0.450–4.500)

## 2017-07-02 LAB — VITAMIN D 25 HYDROXY (VIT D DEFICIENCY, FRACTURES): VIT D 25 HYDROXY: 56.7 ng/mL (ref 30.0–100.0)

## 2017-08-25 ENCOUNTER — Other Ambulatory Visit: Payer: Self-pay | Admitting: Family Medicine

## 2017-08-25 DIAGNOSIS — I10 Essential (primary) hypertension: Secondary | ICD-10-CM

## 2017-08-25 MED ORDER — HYDROCHLOROTHIAZIDE 25 MG PO TABS: 25.0000 mg | ORAL_TABLET | Freq: Every day | ORAL | 1 refills | Status: DC

## 2017-08-25 MED ORDER — METOPROLOL TARTRATE 50 MG PO TABS: 50.0000 mg | ORAL_TABLET | Freq: Two times a day (BID) | ORAL | 1 refills | Status: DC

## 2017-08-25 NOTE — Addendum Note (Signed)
Addended by: Herminio CommonsJOHNSON, Latyra Jaye A on: 08/25/2017 10:50 AM   Modules accepted: Orders

## 2017-09-07 ENCOUNTER — Other Ambulatory Visit: Payer: Self-pay | Admitting: Family Medicine

## 2017-09-07 DIAGNOSIS — B001 Herpesviral vesicular dermatitis: Secondary | ICD-10-CM

## 2017-09-09 NOTE — Telephone Encounter (Signed)
Ok to give 90 days and please find out how often she is taking this.

## 2017-09-09 NOTE — Telephone Encounter (Signed)
Pt was notified and will call back to schedule an appt 

## 2017-09-09 NOTE — Telephone Encounter (Signed)
Is this okay to refill? 

## 2017-09-09 NOTE — Telephone Encounter (Signed)
Pt states she takes this everyday to prevent from getting fever blisters around her mouth or in her nose. No one has never told her to not take them daily.

## 2017-09-09 NOTE — Telephone Encounter (Signed)
Really she should take this as needed unless she has a history of several outbreaks each year. Ok to give 90 tablets and then let's see back in the next 3 months to discuss it.

## 2017-10-27 ENCOUNTER — Ambulatory Visit (INDEPENDENT_AMBULATORY_CARE_PROVIDER_SITE_OTHER): Payer: Medicare Other | Admitting: Family Medicine

## 2017-10-27 ENCOUNTER — Encounter: Payer: Self-pay | Admitting: Family Medicine

## 2017-10-27 VITALS — BP 130/70 | HR 77 | Temp 98.2°F | Resp 16 | Wt 190.6 lb

## 2017-10-27 DIAGNOSIS — Z1239 Encounter for other screening for malignant neoplasm of breast: Secondary | ICD-10-CM

## 2017-10-27 DIAGNOSIS — R21 Rash and other nonspecific skin eruption: Secondary | ICD-10-CM | POA: Diagnosis not present

## 2017-10-27 NOTE — Progress Notes (Signed)
   Subjective:    Patient ID: Cassandra Hendricks, female    DOB: 06-27-1950, 67 y.o.   MRN: [id number]  HPI Chief Complaint  Patient presents with  . flu shot and shingles shot    had shots on tuesday and then wednesday afternoon, woke up thursday and had spots. the spots don't itch, burn,. spots all over. get worse when showering  . needs mammogram    no longer going to obgyn   She is here with complaints of a non pruritic rash for the past 5 days. Rash is not worsening. States the rash started 2 days after getting her flu and Shingrix vaccinations at the same time.  Denies any new medications, foods, detergents, lotions, soaps.   States she has been putting alcohol on the rash.   She has noticed increased fatigue and mild nausea since getting the vaccines as well.  No arthralgias or myalgias. No rash on palms or soles.   Denies fever, chills, dizziness, chest pain, palpitations, shortness of breath, cough, abdominal pain, V/D.    Review of Systems Pertinent positives and negatives in the history of present illness.     Objective:   Physical Exam BP 130/70   Pulse 77   Temp 98.2 F (36.8 C) (Oral)   Resp 16   Wt 190 lb 9.6 oz (86.5 kg)   SpO2 98%   BMI 34.03 kg/m   Alert and in no distress.  Pharyngeal area is normal. Neck is supple without adenopathy or thyromegaly. Diffuse red, raised, rough rash on her upper and lower extremities and sparingly on her trunk.       Assessment & Plan:  Rash and nonspecific skin eruption  Discussed that rash etiology is unclear. She is not worsening and rash is not bothersome except she does not like how it looks. I expect that she will continue to improve. Stop using alcohol on the rash and start using a moisturizer. No sign of infection.  Follow up if new or worsening symptoms.

## 2017-10-28 NOTE — Addendum Note (Signed)
Addended by: Herminio Commons A on: 10/28/2017 10:32 AM   Modules accepted: Orders

## 2017-10-28 NOTE — Addendum Note (Signed)
Addended by: Herminio Commons A on: 10/28/2017 10:22 AM   Modules accepted: Orders

## 2017-12-11 ENCOUNTER — Telehealth: Payer: Self-pay | Admitting: Internal Medicine

## 2017-12-11 DIAGNOSIS — B001 Herpesviral vesicular dermatitis: Secondary | ICD-10-CM

## 2017-12-11 MED ORDER — VALACYCLOVIR HCL 500 MG PO TABS: 500.0000 mg | ORAL_TABLET | Freq: Every day | ORAL | 0 refills | Status: DC

## 2017-12-11 NOTE — Addendum Note (Signed)
Addended by: Herminio CommonsJOHNSON, Daran Favaro A on: 12/11/2017 10:52 AM   Modules accepted: Orders

## 2017-12-11 NOTE — Telephone Encounter (Signed)
Ok to refill and looks like she is due for her annual. She can schedule this at her convenience.

## 2017-12-11 NOTE — Telephone Encounter (Signed)
Done and pt has an appt next week

## 2017-12-11 NOTE — Telephone Encounter (Signed)
Is this okay to refill valtrex 500mg 

## 2017-12-16 ENCOUNTER — Ambulatory Visit
Admission: RE | Admit: 2017-12-16 | Discharge: 2017-12-16 | Disposition: A | Discharge status: Home / Self Care | Payer: Medicare Other | Source: Ambulatory Visit | Attending: Family Medicine | Admitting: Family Medicine

## 2017-12-16 DIAGNOSIS — Z1239 Encounter for other screening for malignant neoplasm of breast: Secondary | ICD-10-CM

## 2017-12-22 ENCOUNTER — Encounter: Payer: Self-pay | Admitting: Family Medicine

## 2017-12-22 ENCOUNTER — Ambulatory Visit (INDEPENDENT_AMBULATORY_CARE_PROVIDER_SITE_OTHER): Payer: Medicare Other | Admitting: Family Medicine

## 2017-12-22 VITALS — BP 122/70 | HR 70 | Wt 189.2 lb

## 2017-12-22 DIAGNOSIS — G8929 Other chronic pain: Secondary | ICD-10-CM

## 2017-12-22 DIAGNOSIS — Z87898 Personal history of other specified conditions: Secondary | ICD-10-CM

## 2017-12-22 DIAGNOSIS — E782 Mixed hyperlipidemia: Secondary | ICD-10-CM

## 2017-12-22 DIAGNOSIS — I10 Essential (primary) hypertension: Secondary | ICD-10-CM | POA: Diagnosis not present

## 2017-12-22 DIAGNOSIS — R5383 Other fatigue: Secondary | ICD-10-CM

## 2017-12-22 DIAGNOSIS — Z79899 Other long term (current) drug therapy: Secondary | ICD-10-CM

## 2017-12-22 DIAGNOSIS — M25561 Pain in right knee: Secondary | ICD-10-CM

## 2017-12-22 MED ORDER — ALBUTEROL SULFATE HFA 108 (90 BASE) MCG/ACT IN AERS: 2.0000 | INHALATION_SPRAY | Freq: Four times a day (QID) | RESPIRATORY_TRACT | 0 refills | Status: DC | PRN

## 2017-12-22 NOTE — Progress Notes (Signed)
   Subjective:    Patient ID: Cassandra Hendricks, female    DOB: 04/27/1950, 67 y.o.   MRN: [id number]  HPI Chief Complaint  Patient presents with  . med check    med check and bp check    She is here for a 6 month medication management visit.   HTN- reports good compliance with medications. No side effects.  History of prediabetes.    Feeling more tired than usual. States she has more mental and physical stress with her sister-in-law being sick.  States she doesn't feel like herself. Not as upbeat.  States she does not feel depressed.   Requests refill of albuterol inhaler. States she uses this only after using certain chemicals and cleaning solutions which cause her to wheeze. This is typically once per month on average.  States her albuterol is 68 years old.  Denies history of asthma, bronchitis, pneumonia.  Not a smoker.  Denies fever, chills, unexplained weight loss, chest pain, palpitations, cough, shortness of breath, abdominal pain, N/V/D, urinary symptoms, LE edema.   Celebrex daily for the past 3-4 months for chronic right knee pain. States this is helping her pain. Reports being followed by her ortho for this. History of multiple knee injections.   Flu shot. Shingrix done per patient.   Reviewed allergies, medications, past medical, surgical, family, and social history.    Review of Systems Pertinent positives and negatives in the history of present illness.     Objective:   Physical Exam BP 122/70   Pulse 70   Wt 189 lb 3.2 oz (85.8 kg)   BMI 33.78 kg/m  Alert and oriented and in no distress.  Pharyngeal area is normal. Neck is supple without adenopathy or thyromegaly. Cardiac exam shows a regular sinus rhythm without murmurs or gallops. Lungs are clear to auscultation. Extremities without edema, pulses intact. Skin is warm and dry. Speech, mood, thought process normal.       Assessment & Plan:  Essential hypertension - Plan: CBC with  Differential/Platelet, Comprehensive metabolic panel  History of prediabetes - Plan: Hemoglobin A1c  Mixed hyperlipidemia  Medication management  Fatigue, unspecified type - Plan: TSH, VITAMIN D 25 Hydroxy (Vit-D Deficiency, Fractures), Vitamin B12, T4, free, Iron, TIBC and Ferritin Panel  Chronic pain of right knee  Blood pressure is in goal range.  Continue on current medication regimen. Prediabetes-we will check hemoglobin A1c. Fatigue-discussed multiple etiologies.  Currently under more physical and mental stress due to illness of sister-in-law and being her caregiver.  Denies depression.  Check labs Chronic pain right knee-she has been taking daily meloxicam and I recommend that she try to cut back.  She may need to follow-up with her orthopedist Discussed that even a small amount of weight loss would help improve knee pain. Follow-up pending labs or in 6 months.

## 2017-12-22 NOTE — Patient Instructions (Signed)
Your blood pressure today is normal at 122/70.  Continue on current medication regimen.  Try to cut back on the Celebrex.  If you are needing the albuterol inhaler more than 1-2 times per month then you should come in and be seen.  We will call you with your lab results.

## 2017-12-23 ENCOUNTER — Other Ambulatory Visit: Payer: Self-pay | Admitting: Internal Medicine

## 2017-12-23 ENCOUNTER — Ambulatory Visit: Payer: Medicare Other | Admitting: Obstetrics & Gynecology

## 2017-12-23 LAB — COMPREHENSIVE METABOLIC PANEL
ALT: 26 IU/L (ref 0–32)
AST: 24 IU/L (ref 0–40)
Albumin/Globulin Ratio: 2 (ref 1.2–2.2)
Albumin: 4.5 g/dL (ref 3.6–4.8)
Alkaline Phosphatase: 64 IU/L (ref 39–117)
BUN/Creatinine Ratio: 34 — ABNORMAL HIGH (ref 12–28)
BUN: 22 mg/dL (ref 8–27)
Bilirubin Total: 0.4 mg/dL (ref 0.0–1.2)
CO2: 24 mmol/L (ref 20–29)
CREATININE: 0.65 mg/dL (ref 0.57–1.00)
Calcium: 9.8 mg/dL (ref 8.7–10.3)
Chloride: 100 mmol/L (ref 96–106)
GFR calc Af Amer: 105 mL/min/{1.73_m2} (ref >59)
GFR, EST NON AFRICAN AMERICAN: 92 mL/min/{1.73_m2} (ref >59)
GLUCOSE: 89 mg/dL (ref 65–99)
Globulin, Total: 2.3 g/dL (ref 1.5–4.5)
Potassium: 4.3 mmol/L (ref 3.5–5.2)
Sodium: 141 mmol/L (ref 134–144)
Total Protein: 6.8 g/dL (ref 6.0–8.5)

## 2017-12-23 LAB — CBC WITH DIFFERENTIAL/PLATELET
Basophils Absolute: 0.1 10*3/uL (ref 0.0–0.2)
Basos: 1 %
EOS (ABSOLUTE): 0.2 10*3/uL (ref 0.0–0.4)
Eos: 2 %
Hematocrit: 39.5 % (ref 34.0–46.6)
Hemoglobin: 13.8 g/dL (ref 11.1–15.9)
IMMATURE GRANULOCYTES: 0 %
Immature Grans (Abs): 0 10*3/uL (ref 0.0–0.1)
Lymphocytes Absolute: 2.3 10*3/uL (ref 0.7–3.1)
Lymphs: 31 %
MCH: 30.1 pg (ref 26.6–33.0)
MCHC: 34.9 g/dL (ref 31.5–35.7)
MCV: 86 fL (ref 79–97)
MONOCYTES: 7 %
Monocytes Absolute: 0.5 10*3/uL (ref 0.1–0.9)
Neutrophils Absolute: 4.4 10*3/uL (ref 1.4–7.0)
Neutrophils: 59 %
Platelets: 154 10*3/uL (ref 150–450)
RBC: 4.59 x10E6/uL (ref 3.77–5.28)
RDW: 12 % — ABNORMAL LOW (ref 12.3–15.4)
WBC: 7.4 10*3/uL (ref 3.4–10.8)

## 2017-12-23 LAB — IRON,TIBC AND FERRITIN PANEL
Ferritin: 123 ng/mL (ref 15–150)
Iron Saturation: 23 % (ref 15–55)
Iron: 82 ug/dL (ref 27–139)
Total Iron Binding Capacity: 363 ug/dL (ref 250–450)
UIBC: 281 ug/dL (ref 118–369)

## 2017-12-23 LAB — T4, FREE: FREE T4: 1.1 ng/dL (ref 0.82–1.77)

## 2017-12-23 LAB — HEMOGLOBIN A1C
Est. average glucose Bld gHb Est-mCnc: 128 mg/dL
HEMOGLOBIN A1C: 6.1 % — AB (ref 4.8–5.6)

## 2017-12-23 LAB — TSH: TSH: 1.65 u[IU]/mL (ref 0.450–4.500)

## 2017-12-23 LAB — VITAMIN B12: Vitamin B-12: 819 pg/mL (ref 232–1245)

## 2017-12-23 LAB — VITAMIN D 25 HYDROXY (VIT D DEFICIENCY, FRACTURES): Vit D, 25-Hydroxy: 57.4 ng/mL (ref 30.0–100.0)

## 2017-12-24 ENCOUNTER — Telehealth: Payer: Self-pay | Admitting: Family Medicine

## 2017-12-24 LAB — LIPID PANEL
Chol/HDL Ratio: 3.5 ratio (ref 0.0–4.4)
Cholesterol, Total: 195 mg/dL (ref 100–199)
HDL: 56 mg/dL (ref >39)
LDL Calculated: 119 mg/dL — ABNORMAL HIGH (ref 0–99)
Triglycerides: 102 mg/dL (ref 0–149)
VLDL Cholesterol Cal: 20 mg/dL (ref 5–40)

## 2017-12-24 LAB — SPECIMEN STATUS REPORT

## 2017-12-24 NOTE — Telephone Encounter (Signed)
Pt called for copy of labs.  done

## 2018-03-16 ENCOUNTER — Other Ambulatory Visit: Payer: Self-pay | Admitting: Family Medicine

## 2018-03-16 DIAGNOSIS — B001 Herpesviral vesicular dermatitis: Secondary | ICD-10-CM

## 2018-03-16 NOTE — Telephone Encounter (Signed)
Is this okay to refill? 

## 2018-06-28 ENCOUNTER — Other Ambulatory Visit: Payer: Self-pay | Admitting: Family Medicine

## 2018-06-28 DIAGNOSIS — B001 Herpesviral vesicular dermatitis: Secondary | ICD-10-CM

## 2018-06-29 NOTE — Telephone Encounter (Signed)
Is this okay to refill? 

## 2018-08-19 ENCOUNTER — Telehealth: Payer: Self-pay | Admitting: Family Medicine

## 2018-08-19 DIAGNOSIS — I10 Essential (primary) hypertension: Secondary | ICD-10-CM

## 2018-08-19 MED ORDER — METOPROLOL TARTRATE 50 MG PO TABS: 50.0000 mg | ORAL_TABLET | Freq: Two times a day (BID) | ORAL | 0 refills | Status: DC

## 2018-08-19 MED ORDER — HYDROCHLOROTHIAZIDE 25 MG PO TABS: 25.0000 mg | ORAL_TABLET | Freq: Every day | ORAL | 0 refills | Status: DC

## 2018-08-19 NOTE — Telephone Encounter (Signed)
Pt called and states she received a call stating she needs an appt. She states she has an appt schedule for 09/22/2018. Please refill meds

## 2018-08-19 NOTE — Telephone Encounter (Signed)
Done

## 2018-08-19 NOTE — Telephone Encounter (Signed)
Needs virtual visit or in office

## 2018-08-19 NOTE — Telephone Encounter (Signed)
Pt needs refill on Metoprolol and Hydrochlorthiazide sent to the [facility] on [address].

## 2018-09-22 ENCOUNTER — Encounter: Payer: Self-pay | Admitting: Family Medicine

## 2018-09-22 ENCOUNTER — Other Ambulatory Visit: Payer: Self-pay

## 2018-09-22 ENCOUNTER — Ambulatory Visit (INDEPENDENT_AMBULATORY_CARE_PROVIDER_SITE_OTHER): Payer: Medicare Other | Admitting: Family Medicine

## 2018-09-22 VITALS — BP 130/80 | HR 70 | Temp 98.2°F | Wt 181.8 lb

## 2018-09-22 DIAGNOSIS — Z79899 Other long term (current) drug therapy: Secondary | ICD-10-CM

## 2018-09-22 DIAGNOSIS — I10 Essential (primary) hypertension: Secondary | ICD-10-CM | POA: Diagnosis not present

## 2018-09-22 DIAGNOSIS — R7303 Prediabetes: Secondary | ICD-10-CM

## 2018-09-22 DIAGNOSIS — Z23 Encounter for immunization: Secondary | ICD-10-CM

## 2018-09-22 DIAGNOSIS — Z1211 Encounter for screening for malignant neoplasm of colon: Secondary | ICD-10-CM

## 2018-09-22 DIAGNOSIS — R233 Spontaneous ecchymoses: Secondary | ICD-10-CM

## 2018-09-22 DIAGNOSIS — R238 Other skin changes: Secondary | ICD-10-CM

## 2018-09-22 NOTE — Progress Notes (Signed)
Subjective:    Patient ID: Cassandra Hendricks, female    DOB: 07/25/1950, 68 y.o.   MRN: [id number]  HPI Chief Complaint  Patient presents with  . med check    med check, flu shot given today,    She is here for a medication management visit. States she has several concerns today.   Prediabetes history. Last Hgb A1c was 6.1% in 12/2017 Diet has been fairly unhealthy. No recent exercise.   HTN- taking medications without any difficulty.   Denies fever, chills, dizziness, chest pain, palpitations, shortness of breath, abdominal pain, N/V/D, urinary symptoms, LE edema.   States she is bruising easier than usual.   States she went to [facility] [address] on Sunday for right ear pain. She was given Cipro for external otitis media. This seems to be resolved.   [facility] cellulitis from presumed bug bite. She completed a course of antibiotics. This has cleared up.   She has also been to her dermatologist for head lice. This has resolved. Reports using steroid cream daily for several months all over. States her skin feels like it is dry and cracking.   States she has some type of bugs in her house and she is having her house exterminated. Has been using a lot of cleaning chemicals.   Would like a cologuard test. Last one was 3 years, May 2017 and it was negative.   She would also like to do [facility] screening and wants my input.   Reviewed allergies, medications, past medical, surgical, family, and social history.    Review of Systems Pertinent positives and negatives in the history of present illness.     Objective:   Physical Exam Constitutional:      General: She is not in acute distress.    Appearance: Normal appearance. She is not ill-appearing.  Eyes:     Conjunctiva/sclera: Conjunctivae normal.  Neck:     Musculoskeletal: Normal range of motion and neck supple.  Cardiovascular:     Rate and Rhythm: Normal rate and  regular rhythm.     Pulses: Normal pulses.  Pulmonary:     Effort: Pulmonary effort is normal.     Breath sounds: Normal breath sounds.  Musculoskeletal:     Right lower leg: No edema.     Left lower leg: No edema.  Skin:    General: Skin is warm and dry.     Capillary Refill: Capillary refill takes less than 2 seconds.     Findings: Bruising present.     Comments: Multiple bruises on her forearms, mainly the left.  Dry patches of skin on her arms and legs. No sign of infection.   Neurological:     General: No focal deficit present.     Mental Status: She is alert and oriented to person, place, and time.     Cranial Nerves: No cranial nerve deficit.     Sensory: No sensory deficit.     Motor: No weakness.     Gait: Gait normal.  Psychiatric:        Mood and Affect: Mood normal.        Thought Content: Thought content normal.    BP 130/80   Pulse 70   Temp 98.2 F (36.8 C) (Oral)   Wt 181 lb 12.8 oz (82.5 kg)   SpO2 98%   BMI 32.46 kg/m         Assessment & Plan:  Essential hypertension - Plan: CBC with Differential/Platelet, Comprehensive metabolic panel -BP in goal range. Continue on medication. Low sodium diet recommended.   Screen for colon cancer - Plan: Cologuard. Per guidelines this was ordered. Her last one was in 2017  Prediabetes - Plan: Comprehensive metabolic panel, TSH, T4, free, Hemoglobin A1c -recommend a healthy low carbohydrate diet and increased activity   Medication management- adjust medication as appropriate pending labs.   Easy bruising - Plan: CBC with Differential/Platelet- suspect this is related to long term use of topical steroids. Recommend she stop using this. Check labs and follow up.   Needs flu shot - Plan: Flu Vaccine QUAD High Dose(Fluad)

## 2018-09-23 DIAGNOSIS — Z23 Encounter for immunization: Secondary | ICD-10-CM

## 2018-09-23 LAB — COMPREHENSIVE METABOLIC PANEL
ALT: 34 IU/L — ABNORMAL HIGH (ref 0–32)
AST: 30 IU/L (ref 0–40)
Albumin/Globulin Ratio: 2.2 (ref 1.2–2.2)
Albumin: 4.4 g/dL (ref 3.8–4.8)
Alkaline Phosphatase: 68 IU/L (ref 39–117)
BUN/Creatinine Ratio: 38 — ABNORMAL HIGH (ref 12–28)
BUN: 21 mg/dL (ref 8–27)
Bilirubin Total: 0.6 mg/dL (ref 0.0–1.2)
CO2: 26 mmol/L (ref 20–29)
Calcium: 9.8 mg/dL (ref 8.7–10.3)
Chloride: 102 mmol/L (ref 96–106)
Creatinine, Ser: 0.55 mg/dL — ABNORMAL LOW (ref 0.57–1.00)
GFR calc Af Amer: 111 mL/min/{1.73_m2} (ref >59)
GFR calc non Af Amer: 96 mL/min/{1.73_m2} (ref >59)
Globulin, Total: 2 g/dL (ref 1.5–4.5)
Glucose: 108 mg/dL — ABNORMAL HIGH (ref 65–99)
Potassium: 3.5 mmol/L (ref 3.5–5.2)
Sodium: 139 mmol/L (ref 134–144)
Total Protein: 6.4 g/dL (ref 6.0–8.5)

## 2018-09-23 LAB — T4, FREE: Free T4: 1.36 ng/dL (ref 0.82–1.77)

## 2018-09-23 LAB — CBC WITH DIFFERENTIAL/PLATELET
Basophils Absolute: 0.1 10*3/uL (ref 0.0–0.2)
Basos: 1 %
EOS (ABSOLUTE): 0.2 10*3/uL (ref 0.0–0.4)
Eos: 2 %
Hematocrit: 40.4 % (ref 34.0–46.6)
Hemoglobin: 14.1 g/dL (ref 11.1–15.9)
Immature Grans (Abs): 0 10*3/uL (ref 0.0–0.1)
Immature Granulocytes: 0 %
Lymphocytes Absolute: 1.7 10*3/uL (ref 0.7–3.1)
Lymphs: 19 %
MCH: 31.6 pg (ref 26.6–33.0)
MCHC: 34.9 g/dL (ref 31.5–35.7)
MCV: 91 fL (ref 79–97)
Monocytes Absolute: 0.6 10*3/uL (ref 0.1–0.9)
Monocytes: 7 %
Neutrophils Absolute: 6.5 10*3/uL (ref 1.4–7.0)
Neutrophils: 71 %
Platelets: 151 10*3/uL (ref 150–450)
RBC: 4.46 x10E6/uL (ref 3.77–5.28)
RDW: 12.5 % (ref 11.7–15.4)
WBC: 9.1 10*3/uL (ref 3.4–10.8)

## 2018-09-23 LAB — TSH: TSH: 1.43 u[IU]/mL (ref 0.450–4.500)

## 2018-09-23 LAB — HEMOGLOBIN A1C
Est. average glucose Bld gHb Est-mCnc: 126 mg/dL
Hgb A1c MFr Bld: 6 % — ABNORMAL HIGH (ref 4.8–5.6)

## 2018-09-24 ENCOUNTER — Other Ambulatory Visit: Payer: Self-pay

## 2018-09-24 ENCOUNTER — Ambulatory Visit (INDEPENDENT_AMBULATORY_CARE_PROVIDER_SITE_OTHER): Payer: Medicare Other | Admitting: Family Medicine

## 2018-09-24 ENCOUNTER — Encounter: Payer: Self-pay | Admitting: Family Medicine

## 2018-09-24 VITALS — BP 128/74 | HR 72 | Temp 98.1°F | Ht 64.0 in | Wt 183.0 lb

## 2018-09-24 DIAGNOSIS — L98491 Non-pressure chronic ulcer of skin of other sites limited to breakdown of skin: Secondary | ICD-10-CM | POA: Diagnosis not present

## 2018-09-24 DIAGNOSIS — R238 Other skin changes: Secondary | ICD-10-CM

## 2018-09-24 DIAGNOSIS — I10 Essential (primary) hypertension: Secondary | ICD-10-CM

## 2018-09-24 DIAGNOSIS — B372 Candidiasis of skin and nail: Secondary | ICD-10-CM | POA: Diagnosis not present

## 2018-09-24 DIAGNOSIS — R233 Spontaneous ecchymoses: Secondary | ICD-10-CM

## 2018-09-24 MED ORDER — MUPIROCIN 2 % EX OINT: 1.0000 "application " | TOPICAL_OINTMENT | Freq: Two times a day (BID) | CUTANEOUS | 0 refills | Status: DC

## 2018-09-24 NOTE — Patient Instructions (Addendum)
Use bactroban ointment twice daily to the open areas of skin until healed. Use lamisil or lotrimin twice daily under your bra, and can also use to the rash around the cut in your lower central abdomen Continue to use powder to help absorb moisture, and cotton underwear and bra. If the rash under the bra doesn't resolve after 2 weeks of that cream, we may need to change to a different antifungal (nystatin cream).  Return or contact us if you increasing redness, swelling, pain, drainage or fever.

## 2018-09-24 NOTE — Progress Notes (Signed)
Chief Complaint  Patient presents with  . Consult    strecth mark on her lower abdomen is cracked and she thinks it might be infected. Was using triamcinolone cream on all over her body and thinks this thinned her skin out. Also has heat rash under her breasts. Powder doesn't help.     Had flu shot and shingles shot.  Had a rash, saw dermatologist, and prescribed a steroid cream.  Rash was on legs (dry, scaly patches). Rash resolved with cream. She was still using cream at various places on her body (including the stretch marks).[place]. Vickie told her to stop using rx cream, and use moisturizing lotion. She is using Aveeno moisturizer (and soap), which is helping with the dry areas/rash on her arms and legs.  She has "bad stretchmarks" She noticed yesterday that "something isn't right"--there is a sore and increased redness. Denies any drainage or fever.  She also complaints of a rash under her breasts, also itchy, bilateral.  PMH, PSH, SH reviewed  Outpatient Encounter Medications as of 09/24/2018  Medication Sig  . celecoxib (CELEBREX) 200 MG capsule Take 200 mg by mouth daily.   Marland Kitchen. CIPRO HC OTIC suspension INSTILL 4 DROPS INTO EACH EAR TWICE DAILY.  Marland Kitchen. conjugated estrogens (PREMARIN) vaginal cream Place 1 Applicatorful vaginally daily.  Marland Kitchen. glucosamine-chondroitin 500-400 MG tablet Take 1 tablet by mouth 3 (three) times daily.  . hydrochlorothiazide (HYDRODIURIL) 25 MG tablet Take 1 tablet (25 mg total) by mouth daily.  . metoprolol tartrate (LOPRESSOR) 50 MG tablet Take 1 tablet (50 mg total) by mouth 2 (two) times daily.  . Multiple Vitamin (MULTIVITAMIN) tablet Take 1 tablet by mouth daily.  . valACYclovir (VALTREX) 500 MG tablet Take 1 tablet by mouth once daily  . albuterol (PROVENTIL HFA;VENTOLIN HFA) 108 (90 Base) MCG/ACT inhaler Inhale 2 puffs into the lungs every 6 (six) hours as needed for wheezing or shortness of breath. (Patient not taking: Reported on 09/24/2018)   No  facility-administered encounter medications on file as of 09/24/2018.    Allergies  Allergen Reactions  . Codeine Nausea And Vomiting   ROS: no fever, chills, URI symptoms, cough, shortness of breath. +easy bruising, no bleeding. +dry skin, improved over last week. Rash under breast, and skin lesion in stretch marks per HPI  PHYSICAL EXAM:  BP 128/74   Pulse 72   Temp 98.1 F (36.7 C) (Other (Comment))   Ht 5\' 4"  (1.626 m)   Wt 183 lb (83 kg)   BMI 31.41 kg/m   Pleasant, talkative female, in no distress HEENT: conjunctiva and sclera are clear, EOMI. Wearing mask Skin: ecchymoses and purpura on her L>R arms. Some erythema and hyperpigmentation in skin folds below both breasts. Vertical striae present on abdomen.   1-1.5 cm area in skin fold in suprapubic area, at bottom portion of stria that is ulcerated. Skin is split, some white are present, but isn't actually draining. There is surrounding rash of punctate erythematous macules. Also has a small fissure at the right lateral skin fold at area of stretchmark  ASSESSMENT/PLAN:  Skin ulcer, limited to breakdown of skin (HCC) - at risk for cellulitis, not yet present. Has some surrounding yeast/rash.  Use Bactroban BID, antifungal to surrounding rash.  - Plan: mupirocin ointment (BACTROBAN) 2 %  Candidiasis of skin - intertrigo/yeast under breasts.  Rec OTC antifungals such as lamisil or lotrimin. If not resolving, consider Nystatin rx (pt to c/b)  Essential hypertension  Easy bruising - may have thin skin  from chronic topical steroid use; also contributed by Celebrex   Use bactroban ointment twice daily to the open areas of skin until healed. Use lamisil or lotrimin twice daily under your bra, and can also use to the rash around the cut in your lower central abdomen Continue to use powder to help absorb moisture, and cotton underwear and bra.  Return or contact us if you increasing redness, swelling, pain, drainage or  fever.

## 2018-09-25 LAB — SPECIMEN STATUS REPORT

## 2018-09-25 LAB — HEPATITIS PANEL, ACUTE
Hep A IgM: NEGATIVE
Hep B C IgM: NEGATIVE
Hep C Virus Ab: <0.1 s/co ratio (ref 0.0–0.9)
Hepatitis B Surface Ag: NEGATIVE

## 2018-10-07 LAB — COLOGUARD: Cologuard: NEGATIVE

## 2018-10-09 ENCOUNTER — Other Ambulatory Visit: Payer: Self-pay | Admitting: Family Medicine

## 2018-10-09 DIAGNOSIS — B001 Herpesviral vesicular dermatitis: Secondary | ICD-10-CM

## 2018-10-09 NOTE — Telephone Encounter (Signed)
Is this okay to refill? 

## 2018-10-16 ENCOUNTER — Encounter: Payer: Self-pay | Admitting: Internal Medicine

## 2018-11-09 ENCOUNTER — Encounter: Payer: Self-pay | Admitting: Family Medicine

## 2018-11-09 ENCOUNTER — Other Ambulatory Visit: Payer: Self-pay

## 2018-11-09 ENCOUNTER — Ambulatory Visit (INDEPENDENT_AMBULATORY_CARE_PROVIDER_SITE_OTHER): Payer: Medicare Other | Admitting: Family Medicine

## 2018-11-09 VITALS — BP 122/80 | HR 78 | Temp 98.0°F | Wt 180.6 lb

## 2018-11-09 DIAGNOSIS — R238 Other skin changes: Secondary | ICD-10-CM

## 2018-11-09 DIAGNOSIS — R233 Spontaneous ecchymoses: Secondary | ICD-10-CM

## 2018-11-09 DIAGNOSIS — R21 Rash and other nonspecific skin eruption: Secondary | ICD-10-CM | POA: Diagnosis not present

## 2018-11-09 NOTE — Progress Notes (Signed)
   Subjective:    Patient ID: Cassandra Hendricks, female    DOB: May 21, 1950, 68 y.o.   MRN: [id number]  HPI Chief Complaint  Patient presents with  . rash    rash on arms, legs and back x a year.    Here with complaints of dry skin, rash that is non pruritic and easy bruising for the past year. Rash is on her back, arms and legs.  She was seen by her dermatologist initially for this but not in the past year.   She is taking Celebrex for chronic knee pain. When I last saw her for this issue she had been using topical steroids daily for several months.  I had her stop this and use Aveeno and now she is using Cetaphil.  Rash is getting worse. States it is "stinging".   She saw Dr. Tomi Bamberger for rash under the breast and skin ulcer in September. This has resolved.   Denies fever, chills, dizziness, chest pain, palpitations, shortness of breath, abdominal pain, N/V/D, urinary symptoms, LE edema.  No myalgias. Knee pain but no other joint aches.  Denies rash on palms or soles.   Reviewed allergies, medications, past medical, surgical, family, and social history.    Review of Systems Pertinent positives and negatives in the history of present illness.     Objective:   Physical Exam BP 122/80   Pulse 78   Temp 98 F (36.7 C)   Wt 180 lb 9.6 oz (81.9 kg)   BMI 31.00 kg/m   Alert and in no distress. Diffuse areas of dry patches on her upper back, bilateral arms and legs. Base of patches are slightly erythematous. No sign of infection. Skin appears to be thinning on her left arm with faint bruising.        Assessment & Plan:  Rash and nonspecific skin eruption  Easy bruising  Discussed that she does not have any sign of infection and her rash looks somewhat eczema-like. I had Dr. Redmond School examine patient also and no obvious explanation for this rash. Considered starting back on topical steroid however she had been using one when I first saw her for months. I opted to hold off  and have her follow up with her dermatologist. Reviewed labs with patient and normal platelet count. Suspect easy bruising worsened by chronic use of Celebrex.

## 2018-11-10 ENCOUNTER — Other Ambulatory Visit: Payer: Self-pay | Admitting: Family Medicine

## 2018-11-10 DIAGNOSIS — I10 Essential (primary) hypertension: Secondary | ICD-10-CM

## 2018-12-14 ENCOUNTER — Ambulatory Visit: Payer: Medicare Other | Admitting: Family Medicine

## 2018-12-15 ENCOUNTER — Ambulatory Visit (INDEPENDENT_AMBULATORY_CARE_PROVIDER_SITE_OTHER): Payer: Medicare Other | Admitting: Family Medicine

## 2018-12-15 ENCOUNTER — Other Ambulatory Visit: Payer: Self-pay

## 2018-12-15 ENCOUNTER — Encounter: Payer: Self-pay | Admitting: Family Medicine

## 2018-12-15 ENCOUNTER — Telehealth: Payer: Self-pay | Admitting: Family Medicine

## 2018-12-15 VITALS — BP 122/64 | HR 60 | Ht 62.74 in | Wt 176.0 lb

## 2018-12-15 DIAGNOSIS — E559 Vitamin D deficiency, unspecified: Secondary | ICD-10-CM | POA: Insufficient documentation

## 2018-12-15 DIAGNOSIS — Z1231 Encounter for screening mammogram for malignant neoplasm of breast: Secondary | ICD-10-CM

## 2018-12-15 DIAGNOSIS — G8929 Other chronic pain: Secondary | ICD-10-CM

## 2018-12-15 DIAGNOSIS — M8580 Other specified disorders of bone density and structure, unspecified site: Secondary | ICD-10-CM

## 2018-12-15 DIAGNOSIS — Z0000 Encounter for general adult medical examination without abnormal findings: Secondary | ICD-10-CM

## 2018-12-15 DIAGNOSIS — I10 Essential (primary) hypertension: Secondary | ICD-10-CM | POA: Diagnosis not present

## 2018-12-15 DIAGNOSIS — B001 Herpesviral vesicular dermatitis: Secondary | ICD-10-CM | POA: Diagnosis not present

## 2018-12-15 DIAGNOSIS — E782 Mixed hyperlipidemia: Secondary | ICD-10-CM

## 2018-12-15 DIAGNOSIS — Z9109 Other allergy status, other than to drugs and biological substances: Secondary | ICD-10-CM | POA: Diagnosis not present

## 2018-12-15 DIAGNOSIS — R7303 Prediabetes: Secondary | ICD-10-CM | POA: Insufficient documentation

## 2018-12-15 DIAGNOSIS — J4520 Mild intermittent asthma, uncomplicated: Secondary | ICD-10-CM | POA: Insufficient documentation

## 2018-12-15 DIAGNOSIS — E2839 Other primary ovarian failure: Secondary | ICD-10-CM

## 2018-12-15 DIAGNOSIS — M25561 Pain in right knee: Secondary | ICD-10-CM

## 2018-12-15 NOTE — Telephone Encounter (Signed)
Pt would like a copy of her bloodwork when they come in

## 2018-12-15 NOTE — Patient Instructions (Signed)
  Cassandra Hendricks , Thank you for taking time to come for your Medicare Wellness Visit. I appreciate your ongoing commitment to your health goals. Please review the following plan we discussed and let me know if I can assist you in the future.   These are the goals we discussed: Call and schedule your mammogram and bone density tests.   Continue eating a healthy diet and avoiding late night snacks.   Follow up with your orthopedist and dermatologists.     This is a list of the screening recommended for you and due dates:  Health Maintenance  Topic Date Due  . Colon Cancer Screening  01/16/1999  . Mammogram  12/17/2019  . Tetanus Vaccine  05/12/2026  . Flu Shot  Completed  . DEXA scan (bone density measurement)  Completed  .  Hepatitis C: One time screening is recommended by Center for Disease Control  (CDC) for  adults born from [birth year] through 1966.   Completed  . Pneumonia vaccines  Completed

## 2018-12-15 NOTE — Progress Notes (Signed)
Memory Dancelizabeth [place] is a 68 y.o. female who presents for annual wellness visit and follow-up on chronic medical conditions.  She has the following concerns:  HTN- reports taking medications daily without any side effects.   Uses albuterol when she is cleaning and has reactive airway disease flare up due to environmental allergies. Otherwise, no issues breathing.   Chronic right knee pain- taking Celebrex daily and followed by ortho   Estrogen def and osteopenia- due to repeat DEXA. Taking vitamin D (has deficiency) and calcium.   Herpes labialis- takes valtrex daily and no outbreaks.   HL- is not on a statin   No issues with stress incontinence   Prediabetes- last Hgb A1c 6.0% on 09/22/2018. diet has not been very healthy    Immunization History  Administered Date(s) Administered  . Fluad Quad(high Dose 65+) 09/23/2018  . Influenza Split 10/24/2015  . Influenza, High Dose Seasonal PF 11/02/2014, 10/14/2017  . Influenza,inj,Quad PF,6+ Mos 10/04/2016  . Influenza-Unspecified 10/08/2014  . Pneumococcal Conjugate-13 07/01/2017  . Pneumococcal-Unspecified 12/08/2014  . Tdap 05/11/2016  . Zoster 05/08/2015  . Zoster Recombinat (Shingrix) 07/02/2017, 10/14/2017   Last Pap smear: pap smear 2 years ago. Never had an abnormal pap smear. Declines pelvic exam Last mammogram: 12/2017 Last colonoscopy: cologuard- negative 2020 Last DEXA: 2016 and osteopenia  Dentist: Dr. Victoriano LainFulton Ophtho:  Dr. Dione BoozeGroat Exercise: none  Other doctors caring for patient include: Dr. Dione BoozeGroat Dr. Victoriano LainFulton Dr. Cleophas DunkerBassett- Ortho  Depression screen:  See questionnaire below.  [facility] 12/15/2018 07/01/2017 05/20/2016  Decreased Interest 0 0 0  Down, Depressed, Hopeless 0 0 0  PHQ - 2 Score 0 0 0    Fall Risk Screen: see questionnaire below. Fall Risk  12/15/2018 09/22/2018 07/01/2017 05/20/2016  Falls in the past year? 0 1 No No  Number falls in past yr: 0 1 - -  Injury with Fall? 0 1 - -    ADL  screen:  See questionnaire below Functional Status Survey: Is the patient deaf or have difficulty hearing?: No Does the patient have difficulty seeing, even when wearing glasses/contacts?: Yes(due on 12/22) Does the patient have difficulty concentrating, remembering, or making decisions?: No Does the patient have difficulty walking or climbing stairs?: Yes(knee pain) Does the patient have difficulty dressing or bathing?: No Does the patient have difficulty doing errands alone such as visiting a doctor's office or shopping?: No   End of Life Discussion:  Patient does not have a living will and medical power of attorney. Her daughter Flossie BuffyMelissa Culbertson is her HCPOA but she does not have this documented.   Review of Systems Constitutional: -fever, -chills, -sweats, -unexpected weight change, -anorexia, -fatigue Allergy: -sneezing, -itching, -congestion Dermatology: denies changing moles, +rash on legs (derm), lumps, new worrisome lesions ENT: -runny nose, -ear pain, -sore throat, -hoarseness, -sinus pain, -teeth pain, -tinnitus, -hearing loss, -epistaxis Cardiology:  -chest pain, -palpitations, -edema, -orthopnea, -paroxysmal nocturnal dyspnea Respiratory: -cough, -shortness of breath, -dyspnea on exertion, -wheezing, -hemoptysis Gastroenterology: -abdominal pain, -nausea, -vomiting, -diarrhea, -constipation, -blood in stool, -changes in bowel movement, -dysphagia Hematology: -bleeding or bruising problems Musculoskeletal: +arthralgias, -myalgias, -joint swelling, -back pain, -neck pain, -cramping, -gait changes Ophthalmology: -vision changes, -eye redness, -itching, -discharge Urology: -dysuria, -difficulty urinating, -hematuria, -urinary frequency, -urgency, incontinence Neurology: -headache, -weakness, -tingling, -numbness, -speech abnormality, -memory loss, -falls, -dizziness Psychology:  -depressed mood, -agitation, -sleep problems    PHYSICAL EXAM:  BP 122/64   Pulse 60   Ht 5'  2.74" (1.594 m)   Wt  176 lb (79.8 kg)   SpO2 98%   BMI 31.44 kg/m   General Appearance: Alert, cooperative, no distress, appears stated age Head: Normocephalic, without obvious abnormality, atraumatic Eyes: PERRL, conjunctiva/corneas clear, EOM's intact, fundi benign Ears: Normal TM's and external ear canals Nose: Mask in place Throat: Mask in place Neck: Supple, no lymphadenopathy; thyroid: no enlargement/tenderness/nodules; no carotid bruit or JVD Back: Spine nontender, no curvature, ROM normal, no CVA tenderness Lungs: Clear to auscultation bilaterally without wheezes, rales or ronchi; respirations unlabored Chest Wall: No tenderness or deformity Heart: Regular rate and rhythm, S1 and S2 normal, no murmur, rub or gallop Breast Exam: Refuses breast exam Abdomen: Soft, non-tender, nondistended, normoactive bowel sounds, no masses, no hepatosplenomegaly Genitalia: Refuses pelvic exam Rectal: Normal tone, no masses or tenderness; guaiac negative stool Extremities: No clubbing, cyanosis or edema Pulses: 2+ and symmetric all extremities Skin: Skin color, texture, turgor normal, no rashes or lesions Lymph nodes: Cervical, supraclavicular, and axillary nodes normal Neurologic: CNII-XII intact, normal strength, sensation and gait; reflexes 2+ and symmetric throughout Psych: Normal mood, affect, hygiene and grooming.  ASSESSMENT/PLAN: Medicare annual wellness visit, subsequent -Here today for a Medicare annual wellness.  Denies any concerns regarding ability to care for herself and perform ADLs, no memory issues, no falls, no depression.  She appears to be doing well physically and emotionally and in good spirits.  Advanced directives reviewed and she will take the paperwork home to fill out.  She may return with any questions  Routine general medical examination at a health care facility - Plan: CBC calcium and vitamin D to help prevent worsening osteopenia differential, Comprehensive  metabolic panel, TSH, T4, Free -Here today for CPE.  She is not fasting.  Preventive health care reviewed.  She declines any further Pap smears.  She will call and schedule mammogram and bone density.  Colon cancer screening up-to-date with Cologuard.  Discussed safety and health promotion.  Immunizations reviewed and she is up-to-date  Essential hypertension-well-controlled on current medication.  Continue taking these  Mixed hyperlipidemia - Plan: Lipid Panel -check lipid panel and follow-up  Herpes labialis-taking valacyclovir daily for prevention and no outbreaks  Osteopenia determined by x-ray - Plan: DG Bone Density -She will call and schedule her bone density along with the mammogram.  Last bone density was in 2016 in [place].  Counseling on adequate calcium and vitamin D along with weightbearing exercises to prevent worsening bone loss  Environmental allergies-problems with Clorox  Mild intermittent reactive airway disease-this appears to be only an issue when using Clorox for cleaning  Chronic pain of right knee-has follow-up appointment with her orthopedist.  Encouraged cutting back on daily use of Celebrex and trying topical Voltaren gel  Vitamin D deficiency - Plan: Vitamin D, 25-hydroxy -Continue on daily supplement and check vitamin D level  Prediabetes -Previous hemoglobin A1c 6.0% in September and to really to recheck.  Counseling on healthy diet  Encounter for screening mammogram for malignant neoplasm of breast - Plan: MM Digital Screening -She will call and schedule this along with her bone density  Estrogen deficiency - Plan: DG Bone Density -Discussed getting adequate calcium and vitamin D in order to prevent worsening bone loss    Discussed monthly self breast exams and yearly mammograms; at least 30 minutes of aerobic activity at least 5 days/week and weight-bearing exercise 2x/week; proper sunscreen use reviewed; healthy diet, including goals of calcium and  vitamin D intake and alcohol recommendations (less than or equal to 1 drink/day) reviewed;  regular seatbelt use; changing batteries in smoke detectors.  Immunization recommendations discussed.  Colonoscopy recommendations reviewed   Medicare Attestation I have personally reviewed: The patient's medical and social history Their use of alcohol, tobacco or illicit drugs Their current medications and supplements The patient's functional ability including ADLs,fall risks, home safety risks, cognitive, and hearing and visual impairment Diet and physical activities Evidence for depression or mood disorders  The patient's weight, height, and BMI have been recorded in the chart.  I have made referrals, counseling, and provided education to the patient based on review of the above and I have provided the patient with a written personalized care plan for preventive services.     Hetty Blend, NP-C   12/15/2018

## 2018-12-16 LAB — CBC WITH DIFFERENTIAL/PLATELET
Basophils Absolute: 0.1 10*3/uL (ref 0.0–0.2)
Basos: 1 %
EOS (ABSOLUTE): 0.1 10*3/uL (ref 0.0–0.4)
Eos: 2 %
Hematocrit: 37.9 % (ref 34.0–46.6)
Hemoglobin: 13.4 g/dL (ref 11.1–15.9)
Immature Grans (Abs): 0 10*3/uL (ref 0.0–0.1)
Immature Granulocytes: 0 %
Lymphocytes Absolute: 2.2 10*3/uL (ref 0.7–3.1)
Lymphs: 33 %
MCH: 32 pg (ref 26.6–33.0)
MCHC: 35.4 g/dL (ref 31.5–35.7)
MCV: 91 fL (ref 79–97)
Monocytes Absolute: 0.5 10*3/uL (ref 0.1–0.9)
Monocytes: 8 %
Neutrophils Absolute: 3.7 10*3/uL (ref 1.4–7.0)
Neutrophils: 56 %
Platelets: 141 10*3/uL — ABNORMAL LOW (ref 150–450)
RBC: 4.19 x10E6/uL (ref 3.77–5.28)
RDW: 12.5 % (ref 11.7–15.4)
WBC: 6.6 10*3/uL (ref 3.4–10.8)

## 2018-12-16 LAB — COMPREHENSIVE METABOLIC PANEL
ALT: 24 IU/L (ref 0–32)
AST: 27 IU/L (ref 0–40)
Albumin/Globulin Ratio: 2.5 — ABNORMAL HIGH (ref 1.2–2.2)
Albumin: 4.5 g/dL (ref 3.8–4.8)
Alkaline Phosphatase: 70 IU/L (ref 39–117)
BUN/Creatinine Ratio: 32 — ABNORMAL HIGH (ref 12–28)
BUN: 20 mg/dL (ref 8–27)
Bilirubin Total: 0.5 mg/dL (ref 0.0–1.2)
CO2: 26 mmol/L (ref 20–29)
Calcium: 9.8 mg/dL (ref 8.7–10.3)
Chloride: 102 mmol/L (ref 96–106)
Creatinine, Ser: 0.63 mg/dL (ref 0.57–1.00)
GFR calc Af Amer: 106 mL/min/{1.73_m2} (ref >59)
GFR calc non Af Amer: 92 mL/min/{1.73_m2} (ref >59)
Globulin, Total: 1.8 g/dL (ref 1.5–4.5)
Glucose: 83 mg/dL (ref 65–99)
Potassium: 4.6 mmol/L (ref 3.5–5.2)
Sodium: 141 mmol/L (ref 134–144)
Total Protein: 6.3 g/dL (ref 6.0–8.5)

## 2018-12-16 LAB — LIPID PANEL
Chol/HDL Ratio: 2.8 ratio (ref 0.0–4.4)
Cholesterol, Total: 171 mg/dL (ref 100–199)
HDL: 61 mg/dL (ref >39)
LDL Chol Calc (NIH): 97 mg/dL (ref 0–99)
Triglycerides: 69 mg/dL (ref 0–149)
VLDL Cholesterol Cal: 13 mg/dL (ref 5–40)

## 2018-12-16 LAB — TSH: TSH: 1.52 u[IU]/mL (ref 0.450–4.500)

## 2018-12-16 LAB — VITAMIN D 25 HYDROXY (VIT D DEFICIENCY, FRACTURES): Vit D, 25-Hydroxy: 64.1 ng/mL (ref 30.0–100.0)

## 2018-12-16 LAB — T4, FREE: Free T4: 1.21 ng/dL (ref 0.82–1.77)

## 2018-12-21 ENCOUNTER — Encounter: Payer: Self-pay | Admitting: Internal Medicine

## 2019-01-04 ENCOUNTER — Other Ambulatory Visit: Payer: Self-pay

## 2019-01-04 ENCOUNTER — Encounter: Payer: Self-pay | Admitting: Family Medicine

## 2019-01-04 ENCOUNTER — Ambulatory Visit (INDEPENDENT_AMBULATORY_CARE_PROVIDER_SITE_OTHER): Payer: Medicare Other | Admitting: Family Medicine

## 2019-01-04 VITALS — BP 120/70 | HR 80 | Temp 97.7°F | Ht 64.0 in | Wt 175.4 lb

## 2019-01-04 DIAGNOSIS — R21 Rash and other nonspecific skin eruption: Secondary | ICD-10-CM

## 2019-01-04 DIAGNOSIS — R609 Edema, unspecified: Secondary | ICD-10-CM

## 2019-01-04 NOTE — Progress Notes (Signed)
Chief Complaint  Patient presents with  . Rash    red and itchy onl  her legs and some on her arms. A little on her chest. Also thinks her legs and ankles are swollen-read about edema online, she is on her feet all the time. Her platelets came back low 3 weeks ago and she feels like something is going on and wants to know what. Saw Dr. Leonie Man as well.     Patient presents with complaint of rash on her legs. She reports that she saw dermatologist a couple of months ago. She has been dealing with this rash for over a year.  (at one point she was overusing topical steroids and was told to stop) She states the dermatologist told her it was due to "poor circulation".  This concerns her (her brother is a diabetic with circulation problems).  She denies any pain.  She is concerned with the color/discoloration, sometimes reports some of the spots sting a little.  She has used the [place] on those which has helped.  She has noted some swelling in her calves/feet (not today). She researched "edema" and is concerned.  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 Yesterday (her day off) she sat around a lot and watched movies.  Using cetaphil and cerevie lotion.  She mainly is worried about her circulation and her rash, but she spoke of many other topics and concerns today.  She will be 69 in a couple of weeks.  PMH, PSH, SH were reviewed  Outpatient Encounter Medications as of 01/04/2019  Medication Sig Note  . glucosamine-chondroitin 500-400 MG tablet Take 1 tablet by mouth 3 (three) times daily.   . hydrochlorothiazide (HYDRODIURIL) 25 MG tablet Take 1 tablet by mouth once daily   . metoprolol tartrate (LOPRESSOR) 50 MG tablet Take 1 tablet by mouth twice daily   . Multiple Vitamin (MULTIVITAMIN) tablet Take 1 tablet by mouth daily.   . mupirocin ointment (BACTROBAN) 2 % Apply 1 application topically 2 (two) times  daily. Apply to affected areas of skin for up to 10 days 01/04/2019: Has been using on both legs as needed  . valACYclovir (VALTREX) 500 MG tablet Take 1 tablet by mouth once daily   . albuterol (PROVENTIL HFA;VENTOLIN HFA) 108 (90 Base) MCG/ACT inhaler Inhale 2 puffs into the lungs every 6 (six) hours as needed for wheezing or shortness of breath. (Patient not taking: Reported on 01/04/2019)   . celecoxib (CELEBREX) 200 MG capsule Take 200 mg by mouth daily.  01/04/2019: As needed  . [DISCONTINUED] acetaminophen-codeine (TYLENOL #3) 300-30 MG tablet Take 1 tablet by mouth every 6 (six) hours as needed.   . [DISCONTINUED] baclofen (LIORESAL) 10 MG tablet Take 10 mg by mouth 2 (two) times daily as needed.   . [DISCONTINUED] nabumetone (RELAFEN) 500 MG tablet Take 500 mg by mouth 2 (two) times daily as needed.   . [DISCONTINUED] predniSONE (DELTASONE) 10 MG tablet Take 1 tablet by mouth as directed  4 po qd x 2 days, then 3 po x 2d, then 2 po x 2d, then 1 po qd x 2 days    No facility-administered encounter medications on file as of 01/04/2019.   Allergies  Allergen Reactions  . Codeine Nausea And Vomiting   ROS: no fever, chills, URI symptoms, headaches, dizziness.  Intermittent leg swelling.  No claudication. No bleeding, some bruising on her arms (related to carrying vacuums, cleaning).  No chest pain,  shortness of breath.   PHYSICAL EXAM:  BP 120/70   Pulse 80   Temp 97.7 F (36.5 C) (Other (Comment))   Ht 5\' 4"  (1.626 m)   Wt 175 lb 6.4 oz (79.6 kg)   BMI 30.11 kg/m   Well-appearing, talkative female, in good spirits, in no distress HEENT: conjunctiva and sclera are clear, EOMI.  Wearing mask Skin: Some hyperpigmented macules and papules scattered on both lower extremities, possibly a little more medially, some of which are slightly pink, some are raised and slightly dry. One lesions on the posterior/lateral right calf was a telangiectasia that blanches easily. The others did not  appear to be vascular. She did have some superficial small veins, and possible very small varicosities.  No tenderness  Extremities: 2+ PT and DP pulses. No pitting edema (just a small mark from her sock).   Lab Results  Component Value Date   WBC 6.6 12/15/2018   HGB 13.4 12/15/2018   HCT 37.9 12/15/2018   MCV 91 12/15/2018   PLT 141 (L) 12/15/2018   ASSESSMENT/PLAN:  Rash and nonspecific skin eruption - suspect component of venous stasis, some dry skin, some solar/actinic change, discussed poss sun sensitivity from HCTZ  Edema, unspecified type - per pt's history, none noted today.  Ddx reviewed, suspect related to some stasis. possible Na, but is on diuretic.  Low Na info given  I reassured patient that her arterial circulation is normal.  Spent extensive time educating her regarding the possible causes of the rashes, including dry skin (needs to stay hydrated and moisturize), poss prior sun exposure, venous stasis (discussed exercise, compression socks, leg elevation). Briefly mentioned the possibility of sun sensitivity from HCTZ and to be sure to wear proper sunscreen.  Reassured regarding her overall health, given her concerns about age--and not to worry so  Much about what are likely cosmetic issues.    Reassured that platelets just slightly low, not low enough to be the cause of her skin concerns.    25 mins or more spent educating patient today.  The arterial circulation in your legs is normal--do not worry about this (peripheral vascular disease).  Your issue is related to veins, not arteries.  For the swelling in your legs-- We discussed the potential causes including venous stasis (blood pooling in the veins of the legs) as well as retention of fluid related to salt/sodium in the diet. Wearing compression socks if sitting/standing for prolonged periods, getting regular exercise to keep the calf muscles helping move the blood back to the heart, and elevating the legs  when they are swollen can help. Limiting the sodium in your diet can also help.  You are taking a diuretic, which helps control swelling.  This can sometimes lead to drier skin if you aren't drinking enough water.  Be sure to stay well hydrated and moisturize at least 2-3 times daily.  Be sure to use at least SPF of 30 or higher to all sun-exposed areas of skin if outside for more than 20 minutes (or less, due to taking HCTZ).

## 2019-01-04 NOTE — Patient Instructions (Addendum)
The arterial circulation in your legs is normal--do not worry about this (peripheral vascular disease).  Your issue is related to veins, not arteries.  For the swelling in your legs-- We discussed the potential causes including venous stasis (blood pooling in the veins of the legs) as well as retention of fluid related to salt/sodium in the diet. Wearing compression socks if sitting/standing for prolonged periods, getting regular exercise to keep the calf muscles helping move the blood back to the heart, and elevating the legs when they are swollen can help. Limiting the sodium in your diet can also help.  You are taking a diuretic, which helps control swelling.  This can sometimes lead to drier skin if you aren't drinking enough water.  Be sure to stay well hydrated and moisturize at least 2-3 times daily.  Be sure to use at least SPF of 30 or higher to all sun-exposed areas of skin if outside for more than 20 minutes (or less, due to taking HCTZ).  If the rash gets worse or is more bothersome, please return to see the dermatologist.    Low-Sodium Eating Plan Sodium, which is an element that makes up salt, helps you maintain a healthy balance of fluids in your body. Too much sodium can increase your blood pressure and cause fluid and waste to be held in your body. Your health care provider or dietitian may recommend following this plan if you have high blood pressure (hypertension), kidney disease, liver disease, or heart failure. Eating less sodium can help lower your blood pressure, reduce swelling, and protect your heart, liver, and kidneys. What are tips for following this plan? General guidelines  Most people on this plan should limit their sodium intake to 1,500-2,000 mg (milligrams) of sodium each day. Reading food labels   The Nutrition Facts label lists the amount of sodium in one serving of the food. If you eat more than one serving, you must multiply the listed amount of sodium  by the number of servings.  Choose foods with less than 140 mg of sodium per serving.  Avoid foods with 300 mg of sodium or more per serving. Shopping  Look for lower-sodium products, often labeled as "low-sodium" or "no salt added."  Always check the sodium content even if foods are labeled as "unsalted" or "no salt added".  Buy fresh foods. ? Avoid canned foods and premade or frozen meals. ? Avoid canned, cured, or processed meats  Buy breads that have less than 80 mg of sodium per slice. Cooking  Eat more home-cooked food and less restaurant, buffet, and fast food.  Avoid adding salt when cooking. Use salt-free seasonings or herbs instead of table salt or sea salt. Check with your health care provider or pharmacist before using salt substitutes.  Cook with plant-based oils, such as canola, sunflower, or olive oil. Meal planning  When eating at a restaurant, ask that your food be prepared with less salt or no salt, if possible.  Avoid foods that contain MSG (monosodium glutamate). MSG is sometimes added to [place] food, bouillon, and some canned foods. What foods are recommended? The items listed may not be a complete list. Talk with your dietitian about what dietary choices are best for you. Grains Low-sodium cereals, including oats, puffed wheat and rice, and shredded wheat. Low-sodium crackers. Unsalted rice. Unsalted pasta. Low-sodium bread. Whole-grain breads and whole-grain pasta. Vegetables Fresh or frozen vegetables. "No salt added" canned vegetables. "No salt added" tomato sauce and paste. Low-sodium or reduced-sodium tomato  and vegetable juice. Fruits Fresh, frozen, or canned fruit. Fruit juice. Meats and other protein foods Fresh or frozen (no salt added) meat, poultry, seafood, and fish. Low-sodium canned tuna and salmon. Unsalted nuts. Dried peas, beans, and lentils without added salt. Unsalted canned beans. Eggs. Unsalted nut butters. Dairy Milk. Soy milk.  Cheese that is naturally low in sodium, such as ricotta cheese, fresh mozzarella, or Swiss cheese Low-sodium or reduced-sodium cheese. Cream cheese. Yogurt. Fats and oils Unsalted butter. Unsalted margarine with no trans fat. Vegetable oils such as canola or olive oils. Seasonings and other foods Fresh and dried herbs and spices. Salt-free seasonings. Low-sodium mustard and ketchup. Sodium-free salad dressing. Sodium-free light mayonnaise. Fresh or refrigerated horseradish. Lemon juice. Vinegar. Homemade, reduced-sodium, or low-sodium soups. Unsalted popcorn and pretzels. Low-salt or salt-free chips. What foods are not recommended? The items listed may not be a complete list. Talk with your dietitian about what dietary choices are best for you. Grains Instant hot cereals. Bread stuffing, pancake, and biscuit mixes. Croutons. Seasoned rice or pasta mixes. Noodle soup cups. Boxed or frozen macaroni and cheese. Regular salted crackers. Self-rising flour. Vegetables Sauerkraut, pickled vegetables, and relishes. Olives. [place] fries. Onion rings. Regular canned vegetables (not low-sodium or reduced-sodium). Regular canned tomato sauce and paste (not low-sodium or reduced-sodium). Regular tomato and vegetable juice (not low-sodium or reduced-sodium). Frozen vegetables in sauces. Meats and other protein foods Meat or fish that is salted, canned, smoked, spiced, or pickled. Bacon, ham, sausage, hotdogs, corned beef, chipped beef, packaged lunch meats, salt pork, jerky, pickled herring, anchovies, regular canned tuna, sardines, salted nuts. Dairy Processed cheese and cheese spreads. Cheese curds. Blue cheese. Feta cheese. String cheese. Regular cottage cheese. Buttermilk. Canned milk. Fats and oils Salted butter. Regular margarine. Ghee. Bacon fat. Seasonings and other foods Onion salt, garlic salt, seasoned salt, table salt, and sea salt. Canned and packaged gravies. Worcestershire sauce. Tartar sauce.  Barbecue sauce. Teriyaki sauce. Soy sauce, including reduced-sodium. Steak sauce. Fish sauce. Oyster sauce. Cocktail sauce. Horseradish that you find on the shelf. Regular ketchup and mustard. Meat flavorings and tenderizers. Bouillon cubes. Hot sauce and Tabasco sauce. Premade or packaged marinades. Premade or packaged taco seasonings. Relishes. Regular salad dressings. Salsa. Potato and tortilla chips. Corn chips and puffs. Salted popcorn and pretzels. Canned or dried soups. Pizza. Frozen entrees and pot pies. Summary  Eating less sodium can help lower your blood pressure, reduce swelling, and protect your heart, liver, and kidneys.  Most people on this plan should limit their sodium intake to 1,500-2,000 mg (milligrams) of sodium each day.  Canned, boxed, and frozen foods are high in sodium. Restaurant foods, fast foods, and pizza are also very high in sodium. You also get sodium by adding salt to food.  Try to cook at home, eat more fresh fruits and vegetables, and eat less fast food, canned, processed, or prepared foods. This information is not intended to replace advice given to you by your health care provider. Make sure you discuss any questions you have with your health care provider. Document Released: 06/15/2001 Document Revised: 12/06/2016 Document Reviewed: 12/18/2015 Elsevier Patient Education  2020 [employer].

## 2019-01-10 ENCOUNTER — Other Ambulatory Visit: Payer: Self-pay | Admitting: Family Medicine

## 2019-01-10 DIAGNOSIS — B001 Herpesviral vesicular dermatitis: Secondary | ICD-10-CM

## 2019-01-11 NOTE — Telephone Encounter (Signed)
Is this okay to refill? 

## 2019-01-15 ENCOUNTER — Ambulatory Visit: Payer: Medicare Other | Attending: Internal Medicine

## 2019-01-15 DIAGNOSIS — Z20822 Contact with and (suspected) exposure to covid-19: Secondary | ICD-10-CM

## 2019-01-16 ENCOUNTER — Telehealth: Payer: Self-pay

## 2019-01-16 NOTE — Telephone Encounter (Signed)
Caller advise that result not back yet  

## 2019-01-17 ENCOUNTER — Ambulatory Visit: Payer: Self-pay

## 2019-01-17 LAB — NOVEL CORONAVIRUS, NAA: SARS-CoV-2, NAA: DETECTED — AB

## 2019-01-17 NOTE — Telephone Encounter (Signed)
Please see covid results note.  Reason for Disposition . Health Information question, no triage required and triager able to answer question  Answer Assessment - Initial Assessment Questions 1. REASON FOR CALL or QUESTION: "What is your reason for calling today?" or "How can I best help you?" or "What question do you have that I can help answer?"     Results  Protocols used: INFORMATION ONLY CALL-A-AH

## 2019-01-18 ENCOUNTER — Telehealth: Payer: Self-pay

## 2019-01-18 ENCOUNTER — Other Ambulatory Visit: Payer: Self-pay | Admitting: Nurse Practitioner

## 2019-01-18 DIAGNOSIS — U071 COVID-19: Secondary | ICD-10-CM

## 2019-01-18 DIAGNOSIS — I10 Essential (primary) hypertension: Secondary | ICD-10-CM

## 2019-01-18 NOTE — Telephone Encounter (Signed)
Pt. Called stating that Friday she tested positive for COVID had a slight temp., ST, and runny nose Friday only symptoms now are runny nose, very mild case of COVID she says, they wanted her to have an anti-body infusion due to her age and because in her chart stats she has COPD(pt. Told me she doesn't thinks she has COPD). They have her scheduled on Thursday for the infusion she wants to know if you think she needs to get this or not, she would need a ride there and no one is going to want to take her.

## 2019-01-18 NOTE — Telephone Encounter (Signed)
I am not aware of her having COPD. 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 I would have to defer to the providers currently treating her for her current diagnosis and symptoms. It is ultimately her decision as to what treatment she gets.

## 2019-01-18 NOTE — Progress Notes (Signed)
  I connected by phone with Cassandra Hendricks on 01/18/2019 at 10:07 AM to discuss the potential use of an new treatment for mild to moderate COVID-19 viral infection in non-hospitalized patients.  This patient is a 69 y.o. female that meets the FDA criteria for Emergency Use Authorization of bamlanivimab or casirivimab\imdevimab.  Has a (+) direct SARS-CoV-2 viral test result  Has mild or moderate COVID-19   Is ? 69 years of age and weighs ? 40 kg  Is NOT hospitalized due to COVID-19  Is NOT requiring oxygen therapy or requiring an increase in baseline oxygen flow rate due to COVID-19  Is within 10 days of symptom onset  Has at least one of the high risk factor(s) for progression to severe COVID-19 and/or hospitalization as defined in EUA.  Specific high risk criteria : Hypertension   I have spoken and communicated the following to the patient or parent/caregiver:  1. FDA has authorized the emergency use of bamlanivimab and casirivimab\imdevimab for the treatment of mild to moderate COVID-19 in adults and pediatric patients with positive results of direct SARS-CoV-2 viral testing who are 23 years of age and older weighing at least 40 kg, and who are at high risk for progressing to severe COVID-19 and/or hospitalization.  2. The significant known and potential risks and benefits of bamlanivimab and casirivimab\imdevimab, and the extent to which such potential risks and benefits are unknown.  3. Information on available alternative treatments and the risks and benefits of those alternatives, including clinical trials.  4. Patients treated with bamlanivimab and casirivimab\imdevimab should continue to self-isolate and use infection control measures (e.g., wear mask, isolate, social distance, avoid sharing personal items, clean and disinfect "high touch" surfaces, and frequent handwashing) according to CDC guidelines.   5. The patient or parent/caregiver has the option to accept or  refuse bamlanivimab or casirivimab\imdevimab .  After reviewing this information with the patient, The patient agreed to proceed with receiving the bamlanimivab infusion and will be provided a copy of the Fact sheet prior to receiving the infusion.Ivonne Andrew 01/18/2019 10:07 AM

## 2019-01-18 NOTE — Telephone Encounter (Signed)
Spoke to patient. She wanted to know some information on infusion. I advised I did not know much about it but we could see if vickie knew anything on this. Pt does not having anything in her chart about having copd.

## 2019-01-19 NOTE — Telephone Encounter (Signed)
Pt scheduled virtual ?

## 2019-01-19 NOTE — Telephone Encounter (Signed)
Let's schedule a virtual with me this week and I can get all the details and help her with this.

## 2019-01-19 NOTE — Telephone Encounter (Signed)
Pt was notified and will determine if she really needs this.  She was suppose to go to the dentist today but because of covid she couldn't, her dentist is requiring a note stating that she is no longer contagious and does she need to be retested, if so how long does she need to wait. She is due out of quarantine on Sunday 1/17.

## 2019-01-20 ENCOUNTER — Ambulatory Visit (INDEPENDENT_AMBULATORY_CARE_PROVIDER_SITE_OTHER): Payer: Medicare Other | Admitting: Family Medicine

## 2019-01-20 ENCOUNTER — Other Ambulatory Visit: Payer: Self-pay

## 2019-01-20 ENCOUNTER — Encounter: Payer: Self-pay | Admitting: Family Medicine

## 2019-01-20 VITALS — Temp 98.4°F | Wt 168.0 lb

## 2019-01-20 DIAGNOSIS — U071 COVID-19: Secondary | ICD-10-CM | POA: Diagnosis not present

## 2019-01-20 NOTE — Progress Notes (Signed)
   Subjective:  Documentation for virtual audio only. No access to video.   The patient was located at home. 2 patient identifiers used.  The provider was located in the office. The patient did consent to this visit and is aware of possible charges through their insurance for this visit.  The other persons participating in this telemedicine service were none.    Patient ID: Cassandra Hendricks, female    DOB: May 05, 1950, 69 y.o.   MRN: [id number]  HPI Chief Complaint  Patient presents with  . covid positive    some weakness in legs, alittle runny nose, having some sleeping issues, need note for dentist to return after covid   She tested positive for Covid-19 infection on 01/15/2019. Symptoms of sore throat and rhinorrhea started on 01/15/2019.  She received her result on 01/17/2019.  Reports she no longer has sore throat  States her symptoms are currently very mild with rhinorrhea, nausea, weakness in both legs that is intermittent.   States she is eating. No loss of sense of taste or smell.   Plans to return to work on January 26, 2019.  She cleans houses.  Denies fever, chills, headache, dizziness, sinus pain, ear pain, chest pain, palpitations, shortness of breath, cough, abdominal pain, vomiting, diarrhea, urinary symptoms.  States she received a call from the health department regarding her being a candidate for monoclonal antibody infusion.  She has held off on calling them back and would like to know if I think she needs this.  She does not want to have this done.  States she does not have a history of COPD or asthma.  She is currently using albuterol inhaler sparingly when she cleaned with bleach.  Reviewed allergies, medications, past medical, surgical, family, and social history.    Review of Systems Pertinent positives and negatives in the history of present illness.     Objective:   Physical Exam Temp 98.4 F (36.9 C)   Wt 168 lb (76.2 kg)   BMI 28.84 kg/m     Alert and oriented in no acute distress.  No coughing.  Speaking complete sentences without difficulty.  Normal speech, mood and thought process.      Assessment & Plan:  COVID-19 virus infection Limitations of virtual visit. Onset of symptoms and tested positive for Covid-19 on 01/15/2019.  She will continue with symptomatic treatment and quarantine. We discussed that she does qualify for infusion of monoclonal antibodies and she has received a call to sign up for this. She has very mild symptoms at this point and prefers to not get this treatment if I do not think it is absolutely necessary. She is ok to hold off for now.  She may call back at any point if she has new or worsening symptoms and we can refer her to the infusion center for treatment.  She will message or call me on Monday 01/25/2019 to let me know how she is doing and she may be cleared to return to work on 01/26/2019 as long as no changes in her symptoms occur.   Time spent on call was 15 minutes and in review of previous records 20 minutes total.  This virtual service is not related to other E/M service within previous 7 days.

## 2019-01-21 ENCOUNTER — Ambulatory Visit (HOSPITAL_COMMUNITY): Payer: Medicare Other

## 2019-01-25 ENCOUNTER — Telehealth: Payer: Self-pay | Admitting: Family Medicine

## 2019-01-25 NOTE — Telephone Encounter (Signed)
Pt called back and said she will need a work note also. They wont let her return until she is cleared by her pcp. She can come by the office and pick it up or it can be mailed to her

## 2019-01-25 NOTE — Telephone Encounter (Signed)
Ok to give her a return to work note per [employer] guidelines as of 01/26/2019

## 2019-01-25 NOTE — Telephone Encounter (Signed)
Pt stated that she is feeling better, still a little congested but when she blows her nose its clear. She was able to get out the house yesterday and go to her mailbox

## 2019-01-26 NOTE — Telephone Encounter (Signed)
If she had received the antibodies she would have needed to wait 90 days but since she did not get them, I do not think she needs to wait longer than 30 days. This is part of the guidelines that is constantly changing.

## 2019-01-26 NOTE — Telephone Encounter (Signed)
Pt wants to know how long she has to wait until she gets the vaccine now.

## 2019-01-26 NOTE — Telephone Encounter (Signed)
Pt was notified.  

## 2019-01-29 ENCOUNTER — Telehealth: Payer: Self-pay | Admitting: Internal Medicine

## 2019-01-29 NOTE — Telephone Encounter (Signed)
Pt called and states she is on the wait list for the covid vaccine, we told her she shouldn't wait more than 30 days to get it but she is seeing she can wait up to 90 days. Pt is confused and wants clarification?

## 2019-01-29 NOTE — Telephone Encounter (Signed)
She is ok to get these vaccine whenever it is available. No limit as to when she has to get it

## 2019-01-29 NOTE — Telephone Encounter (Signed)
Left message for pt to call me back 

## 2019-01-29 NOTE — Telephone Encounter (Signed)
Pt was notified of results

## 2019-02-15 ENCOUNTER — Other Ambulatory Visit: Payer: Self-pay | Admitting: Family Medicine

## 2019-02-15 DIAGNOSIS — I10 Essential (primary) hypertension: Secondary | ICD-10-CM

## 2019-03-07 ENCOUNTER — Ambulatory Visit: Payer: Medicare Other | Attending: Internal Medicine

## 2019-03-07 DIAGNOSIS — Z23 Encounter for immunization: Secondary | ICD-10-CM

## 2019-03-07 NOTE — Progress Notes (Signed)
   Covid-19 Vaccination Clinic  Name:  Cassandra Hendricks [facility]    MRN: [id number] DOB: 1950/02/21  03/07/2019  Cassandra Hendricks was observed post Covid-19 immunization for 15 minutes without incidence. She was provided with Vaccine Information Sheet and instruction to access the V-Safe system.   Cassandra Hendricks was instructed to call 911 with any severe reactions post vaccine: Marland Kitchen Difficulty breathing  . Swelling of your face and throat  . A fast heartbeat  . A bad rash all over your body  . Dizziness and weakness    Immunizations Administered    Name Date Dose VIS Date Route   Pfizer COVID-19 Vaccine 03/07/2019 10:12 AM 0.3 mL 12/18/2018 Intramuscular   Manufacturer: [employer], [employer]   Lot: [id number]   NDC: [address]-7

## 2019-03-09 ENCOUNTER — Ambulatory Visit (INDEPENDENT_AMBULATORY_CARE_PROVIDER_SITE_OTHER): Payer: Medicare Other | Admitting: Family Medicine

## 2019-03-09 ENCOUNTER — Encounter: Payer: Self-pay | Admitting: Family Medicine

## 2019-03-09 ENCOUNTER — Other Ambulatory Visit: Payer: Self-pay

## 2019-03-09 VITALS — BP 108/68 | HR 65 | Temp 97.3°F | Wt 178.0 lb

## 2019-03-09 DIAGNOSIS — Z114 Encounter for screening for human immunodeficiency virus [HIV]: Secondary | ICD-10-CM

## 2019-03-09 DIAGNOSIS — R238 Other skin changes: Secondary | ICD-10-CM

## 2019-03-09 DIAGNOSIS — Z8616 Personal history of COVID-19: Secondary | ICD-10-CM

## 2019-03-09 DIAGNOSIS — D696 Thrombocytopenia, unspecified: Secondary | ICD-10-CM | POA: Insufficient documentation

## 2019-03-09 DIAGNOSIS — R233 Spontaneous ecchymoses: Secondary | ICD-10-CM

## 2019-03-09 NOTE — Progress Notes (Signed)
   Subjective:    Patient ID: Cassandra Hendricks, female    DOB: 03-Jun-1950, 69 y.o.   MRN: [id number]  HPI Chief Complaint  Patient presents with  . 3 month follow-up    3 month follow-up on platelets   She has had a minor decline in her platelet count over the past couple of years.   States she had to see a hematologist when she lived in [place] for thrombocytopenia.  These records are not available.  States she is not sure what testing was done.  Bruises easily.  Denies fever, chills, night sweats, unexplained weight loss, fatigue. Denies bleeding  States she is not taking aspirin or any other anticoagulant.  History of Covid infection and she states it was very mild.  Recovered completely.  She got the first Covid vaccine 4 days ago. Doing fine.    Review of Systems Pertinent positives and negatives in the history of present illness.     Objective:   Physical Exam BP 108/68   Pulse 65   Temp (!) 97.3 F (36.3 C)   Wt 178 lb (80.7 kg)   BMI 30.55 kg/m   Alert and oriented and in no acute distress. Multiple bruises on her bilateral forearms-chronic per patient    Assessment & Plan:  Thrombocytopenia (HCC) - Plan: CBC with Differential/Platelet, Comprehensive metabolic panel  History of COVID-19  Screening for HIV (human immunodeficiency virus) - Plan: HIV Antibody (routine testing w rflx)  Easy bruising - Plan: CBC with Differential/Platelet, Comprehensive metabolic panel  She is here for 16-month follow-up on thrombocytopenia.  Apparently she has a history of this and was evaluated by hematologist in [place].  This was several years ago and we do not have the records.  Platelet trend has been downward but mild.  Will check labs in follow-up.  I suspect bruising on her arms is more likely related to chronic topical steroid use which she stopped a couple of months ago. Follow-up pending labs

## 2019-03-12 LAB — PATHOLOGIST SMEAR REVIEW
Basophils Absolute: 0.1 10*3/uL (ref 0.0–0.2)
Basos: 1 %
EOS (ABSOLUTE): 0.2 10*3/uL (ref 0.0–0.4)
Eos: 3 %
Hematocrit: 38.5 % (ref 34.0–46.6)
Hemoglobin: 13.7 g/dL (ref 11.1–15.9)
Immature Grans (Abs): 0 10*3/uL (ref 0.0–0.1)
Immature Granulocytes: 0 %
Lymphocytes Absolute: 1.4 10*3/uL (ref 0.7–3.1)
Lymphs: 20 %
MCH: 31.5 pg (ref 26.6–33.0)
MCHC: 35.6 g/dL (ref 31.5–35.7)
MCV: 89 fL (ref 79–97)
Monocytes Absolute: 0.6 10*3/uL (ref 0.1–0.9)
Monocytes: 9 %
Neutrophils Absolute: 4.6 10*3/uL (ref 1.4–7.0)
Neutrophils: 67 %
Platelets: 147 10*3/uL — ABNORMAL LOW (ref 150–450)
RBC: 4.35 x10E6/uL (ref 3.77–5.28)
RDW: 12.2 % (ref 11.7–15.4)
WBC: 6.8 10*3/uL (ref 3.4–10.8)

## 2019-03-12 LAB — COMPREHENSIVE METABOLIC PANEL
ALT: 24 IU/L (ref 0–32)
AST: 22 IU/L (ref 0–40)
Albumin/Globulin Ratio: 2 (ref 1.2–2.2)
Albumin: 4.2 g/dL (ref 3.8–4.8)
Alkaline Phosphatase: 72 IU/L (ref 39–117)
BUN/Creatinine Ratio: 34 — ABNORMAL HIGH (ref 12–28)
BUN: 22 mg/dL (ref 8–27)
Bilirubin Total: 0.2 mg/dL (ref 0.0–1.2)
CO2: 28 mmol/L (ref 20–29)
Calcium: 9.7 mg/dL (ref 8.7–10.3)
Chloride: 102 mmol/L (ref 96–106)
Creatinine, Ser: 0.65 mg/dL (ref 0.57–1.00)
GFR calc Af Amer: 104 mL/min/{1.73_m2} (ref >59)
GFR calc non Af Amer: 90 mL/min/{1.73_m2} (ref >59)
Globulin, Total: 2.1 g/dL (ref 1.5–4.5)
Glucose: 99 mg/dL (ref 65–99)
Potassium: 3.8 mmol/L (ref 3.5–5.2)
Sodium: 142 mmol/L (ref 134–144)
Total Protein: 6.3 g/dL (ref 6.0–8.5)

## 2019-03-12 LAB — HIV ANTIBODY (ROUTINE TESTING W REFLEX): HIV Screen 4th Generation wRfx: NONREACTIVE

## 2019-03-14 NOTE — Progress Notes (Signed)
Her labs are fine. As we expected she is negative for HIV. Her platelets improved slightly and her blood looks fine under the microscope per the pathologist. Nothing further needs to be done at this point.

## 2019-03-23 ENCOUNTER — Other Ambulatory Visit: Payer: Self-pay

## 2019-03-23 ENCOUNTER — Ambulatory Visit
Admission: RE | Admit: 2019-03-23 | Discharge: 2019-03-23 | Disposition: A | Discharge status: Home / Self Care | Payer: Medicare Other | Source: Ambulatory Visit | Attending: Family Medicine | Admitting: Family Medicine

## 2019-03-23 DIAGNOSIS — Z1231 Encounter for screening mammogram for malignant neoplasm of breast: Secondary | ICD-10-CM | POA: Diagnosis not present

## 2019-03-23 DIAGNOSIS — M8580 Other specified disorders of bone density and structure, unspecified site: Secondary | ICD-10-CM

## 2019-03-23 DIAGNOSIS — E2839 Other primary ovarian failure: Secondary | ICD-10-CM

## 2019-03-23 DIAGNOSIS — M85852 Other specified disorders of bone density and structure, left thigh: Secondary | ICD-10-CM | POA: Diagnosis not present

## 2019-03-23 DIAGNOSIS — Z780 Asymptomatic menopausal state: Secondary | ICD-10-CM | POA: Diagnosis not present

## 2019-03-23 NOTE — Progress Notes (Signed)
Her bone density shows that she is still osteopenic as she was when she had her last bone density and does not have osteoporosis. Keep doing what she is doing.

## 2019-03-31 ENCOUNTER — Ambulatory Visit: Payer: Medicare Other | Attending: Internal Medicine

## 2019-03-31 DIAGNOSIS — Z23 Encounter for immunization: Secondary | ICD-10-CM

## 2019-03-31 NOTE — Progress Notes (Signed)
   Covid-19 Vaccination Clinic  Name:  Sindhu Nguyen [facility]    MRN: [id number] DOB: [birth year]  03/31/2019  Ms. Vieyra was observed post Covid-19 immunization for 15 minutes without incident. She was provided with Vaccine Information Sheet and instruction to access the V-Safe system.   Ms. Hardiman was instructed to call 911 with any severe reactions post vaccine: Marland Kitchen Difficulty breathing  . Swelling of face and throat  . A fast heartbeat  . A bad rash all over body  . Dizziness and weakness   Immunizations Administered    Name Date Dose VIS Date Route   Pfizer COVID-19 Vaccine 03/31/2019  4:29 PM 0.3 mL 12/18/2018 Intramuscular   Manufacturer: [employer], [employer]   Lot: [id number]   NDC: [address]-2

## 2019-04-12 ENCOUNTER — Other Ambulatory Visit: Payer: Self-pay | Admitting: Family Medicine

## 2019-04-12 DIAGNOSIS — B001 Herpesviral vesicular dermatitis: Secondary | ICD-10-CM

## 2019-04-20 ENCOUNTER — Telehealth: Payer: Self-pay | Admitting: Internal Medicine

## 2019-04-20 DIAGNOSIS — M549 Dorsalgia, unspecified: Secondary | ICD-10-CM

## 2019-04-20 NOTE — Telephone Encounter (Signed)
Ok to put in a referral for her.

## 2019-04-20 NOTE — Telephone Encounter (Signed)
Pt had an appt with [facility] on 01/12/19 and they are needing a referral sent as they will not see her without referral for her back pain.   Dr. Louellen Molder [phone]

## 2019-04-21 NOTE — Telephone Encounter (Signed)
Pt was notified that referral has been faxed

## 2019-04-29 DIAGNOSIS — M1711 Unilateral primary osteoarthritis, right knee: Secondary | ICD-10-CM | POA: Diagnosis not present

## 2019-04-29 DIAGNOSIS — M47816 Spondylosis without myelopathy or radiculopathy, lumbar region: Secondary | ICD-10-CM | POA: Diagnosis not present

## 2019-05-18 ENCOUNTER — Other Ambulatory Visit: Payer: Self-pay | Admitting: Family Medicine

## 2019-05-18 DIAGNOSIS — I10 Essential (primary) hypertension: Secondary | ICD-10-CM

## 2019-06-15 DIAGNOSIS — M1711 Unilateral primary osteoarthritis, right knee: Secondary | ICD-10-CM | POA: Diagnosis not present

## 2019-06-15 DIAGNOSIS — M47816 Spondylosis without myelopathy or radiculopathy, lumbar region: Secondary | ICD-10-CM | POA: Diagnosis not present

## 2019-06-21 DIAGNOSIS — M545 Low back pain: Secondary | ICD-10-CM | POA: Diagnosis not present

## 2019-06-23 DIAGNOSIS — M1711 Unilateral primary osteoarthritis, right knee: Secondary | ICD-10-CM | POA: Diagnosis not present

## 2019-06-29 DIAGNOSIS — M1711 Unilateral primary osteoarthritis, right knee: Secondary | ICD-10-CM | POA: Diagnosis not present

## 2019-07-12 DIAGNOSIS — M545 Low back pain: Secondary | ICD-10-CM | POA: Diagnosis not present

## 2019-07-14 ENCOUNTER — Other Ambulatory Visit: Payer: Self-pay | Admitting: Family Medicine

## 2019-07-14 DIAGNOSIS — B001 Herpesviral vesicular dermatitis: Secondary | ICD-10-CM

## 2019-07-14 NOTE — Telephone Encounter (Signed)
Is this okay to refill? 

## 2019-08-16 ENCOUNTER — Telehealth: Payer: Self-pay | Admitting: Family Medicine

## 2019-08-16 DIAGNOSIS — I10 Essential (primary) hypertension: Secondary | ICD-10-CM

## 2019-08-16 MED ORDER — HYDROCHLOROTHIAZIDE 25 MG PO TABS: 25.0000 mg | ORAL_TABLET | Freq: Every day | ORAL | 0 refills | Status: DC

## 2019-08-16 MED ORDER — METOPROLOL TARTRATE 50 MG PO TABS: 50.0000 mg | ORAL_TABLET | Freq: Two times a day (BID) | ORAL | 0 refills | Status: DC

## 2019-08-16 NOTE — Telephone Encounter (Signed)
Pt scheduled med ck in August and needs refills Metoprolol & HCTZ to [facility]

## 2019-08-16 NOTE — Addendum Note (Signed)
Addended by: Herminio Commons A on: 08/16/2019 09:51 AM   Modules accepted: Orders

## 2019-08-16 NOTE — Telephone Encounter (Signed)
done

## 2019-08-19 DIAGNOSIS — M545 Low back pain: Secondary | ICD-10-CM | POA: Diagnosis not present

## 2019-08-31 DIAGNOSIS — M545 Low back pain: Secondary | ICD-10-CM | POA: Diagnosis not present

## 2019-09-07 ENCOUNTER — Encounter: Payer: Medicare Other | Admitting: Family Medicine

## 2019-09-12 ENCOUNTER — Other Ambulatory Visit: Payer: Self-pay | Admitting: Family Medicine

## 2019-09-12 DIAGNOSIS — I10 Essential (primary) hypertension: Secondary | ICD-10-CM

## 2019-09-14 DIAGNOSIS — M545 Low back pain: Secondary | ICD-10-CM | POA: Diagnosis not present

## 2019-09-14 NOTE — Telephone Encounter (Signed)
Patient has an appointment tomorrow. Are you ok with refills being sent today?

## 2019-09-15 ENCOUNTER — Ambulatory Visit (INDEPENDENT_AMBULATORY_CARE_PROVIDER_SITE_OTHER): Payer: Medicare Other | Admitting: Family Medicine

## 2019-09-15 ENCOUNTER — Encounter: Payer: Self-pay | Admitting: Family Medicine

## 2019-09-15 ENCOUNTER — Other Ambulatory Visit: Payer: Self-pay

## 2019-09-15 VITALS — BP 124/64 | HR 67 | Wt 183.6 lb

## 2019-09-15 DIAGNOSIS — E782 Mixed hyperlipidemia: Secondary | ICD-10-CM | POA: Diagnosis not present

## 2019-09-15 DIAGNOSIS — Z23 Encounter for immunization: Secondary | ICD-10-CM | POA: Diagnosis not present

## 2019-09-15 DIAGNOSIS — B001 Herpesviral vesicular dermatitis: Secondary | ICD-10-CM

## 2019-09-15 DIAGNOSIS — I10 Essential (primary) hypertension: Secondary | ICD-10-CM

## 2019-09-15 DIAGNOSIS — R7303 Prediabetes: Secondary | ICD-10-CM

## 2019-09-15 MED ORDER — METOPROLOL TARTRATE 50 MG PO TABS: 50.0000 mg | ORAL_TABLET | Freq: Two times a day (BID) | ORAL | 5 refills | Status: DC

## 2019-09-15 MED ORDER — HYDROCHLOROTHIAZIDE 25 MG PO TABS: 25.0000 mg | ORAL_TABLET | Freq: Every day | ORAL | 5 refills | Status: DC

## 2019-09-15 NOTE — Progress Notes (Signed)
° °  Subjective:    Patient ID: Cassandra Hendricks, female    DOB: 03/13/1950, 69 y.o.   MRN: [id number]  HPI Chief Complaint  Patient presents with   med check    med check    Here for a medication management visit.   HTN- does not check her BP at home.  She has been taking Metoprolol and HCTZ for year.   Prediabetes- states she eats sweets sometimes.  Last A1c 6.0% on 09/2018  Osteopenia- last DEXA 03/2019 and it had not worsened significantly. Taking vitamin D and getting plenty of calcium. Walking for exercise.   HL- never been on a statin   Seeing orthopedist for back pain. Taking Celebrex. Getting Pt now.   Taking Valtrex daily to prevent herpes labialis. Has tried to cut back in the past and she gets cold sores.   Denies fever, chills, dizziness, chest pain, palpitations, shortness of breath, abdominal pain, N/V/D, urinary symptoms, LE edema.     Review of Systems Pertinent positives and negatives in the history of present illness.     Objective:   Physical Exam BP 124/64    Pulse 67    Wt 183 lb 9.6 oz (83.3 kg)    BMI 31.51 kg/m   Alert and oriented and in no acute distress. Not otherwise examined.        Assessment & Plan:  Essential hypertension - Plan: CBC with Differential/Platelet, Comprehensive metabolic panel, metoprolol tartrate (LOPRESSOR) 50 MG tablet, hydrochlorothiazide (HYDRODIURIL) 25 MG tablet -Blood pressure is well controlled.  Continue on current medications.  She has not experienced any side effects.  Continue with low-sodium diet.  Mixed hyperlipidemia -Continue eating a healthy diet and exercising.  She is not fasting so I am not checking her lipid panel today.  We will recheck this when she returns for her annual wellness visit  Needs flu shot - Plan: Flu Vaccine QUAD High Dose(Fluad) -Counseling done on the vaccine  Prediabetes - Plan: Comprehensive metabolic panel, Hemoglobin A1c -She has been eating more sweets than usual.   Recommend a diet low in sugar and carbs.  Check A1c and follow-up  Herpes labialis -Continue daily preventive therapy.  She has tried to take this every other day and developed breakouts.

## 2019-09-16 LAB — COMPREHENSIVE METABOLIC PANEL
ALT: 23 IU/L (ref 0–32)
AST: 23 IU/L (ref 0–40)
Albumin/Globulin Ratio: 1.8 (ref 1.2–2.2)
Albumin: 4.1 g/dL (ref 3.8–4.8)
Alkaline Phosphatase: 68 IU/L (ref 48–121)
BUN/Creatinine Ratio: 35 — ABNORMAL HIGH (ref 12–28)
BUN: 19 mg/dL (ref 8–27)
Bilirubin Total: 0.4 mg/dL (ref 0.0–1.2)
CO2: 28 mmol/L (ref 20–29)
Calcium: 9.6 mg/dL (ref 8.7–10.3)
Chloride: 103 mmol/L (ref 96–106)
Creatinine, Ser: 0.55 mg/dL — ABNORMAL LOW (ref 0.57–1.00)
GFR calc Af Amer: 110 mL/min/{1.73_m2} (ref >59)
GFR calc non Af Amer: 95 mL/min/{1.73_m2} (ref >59)
Globulin, Total: 2.3 g/dL (ref 1.5–4.5)
Glucose: 92 mg/dL (ref 65–99)
Potassium: 3.9 mmol/L (ref 3.5–5.2)
Sodium: 143 mmol/L (ref 134–144)
Total Protein: 6.4 g/dL (ref 6.0–8.5)

## 2019-09-16 LAB — CBC WITH DIFFERENTIAL/PLATELET
Basophils Absolute: 0.1 10*3/uL (ref 0.0–0.2)
Basos: 1 %
EOS (ABSOLUTE): 0.2 10*3/uL (ref 0.0–0.4)
Eos: 3 %
Hematocrit: 35.9 % (ref 34.0–46.6)
Hemoglobin: 13.1 g/dL (ref 11.1–15.9)
Immature Grans (Abs): 0 10*3/uL (ref 0.0–0.1)
Immature Granulocytes: 0 %
Lymphocytes Absolute: 2.2 10*3/uL (ref 0.7–3.1)
Lymphs: 30 %
MCH: 33.2 pg — ABNORMAL HIGH (ref 26.6–33.0)
MCHC: 36.5 g/dL — ABNORMAL HIGH (ref 31.5–35.7)
MCV: 91 fL (ref 79–97)
Monocytes Absolute: 0.6 10*3/uL (ref 0.1–0.9)
Monocytes: 8 %
Neutrophils Absolute: 4.4 10*3/uL (ref 1.4–7.0)
Neutrophils: 58 %
Platelets: 120 10*3/uL — ABNORMAL LOW (ref 150–450)
RBC: 3.95 x10E6/uL (ref 3.77–5.28)
RDW: 12.2 % (ref 11.7–15.4)
WBC: 7.5 10*3/uL (ref 3.4–10.8)

## 2019-09-16 LAB — HEMOGLOBIN A1C
Est. average glucose Bld gHb Est-mCnc: 128 mg/dL
Hgb A1c MFr Bld: 6.1 % — ABNORMAL HIGH (ref 4.8–5.6)

## 2019-09-16 NOTE — Progress Notes (Signed)
Please let her know that I want her to see a hematologist (blood specialist) due to thrombocytopenia (platelets dropping). Her blood sugars are still in prediabetes range. Please put in referral.

## 2019-09-20 ENCOUNTER — Telehealth: Payer: Self-pay

## 2019-09-20 DIAGNOSIS — D696 Thrombocytopenia, unspecified: Secondary | ICD-10-CM

## 2019-09-20 NOTE — Telephone Encounter (Signed)
Put in referral to hematology

## 2019-09-20 NOTE — Telephone Encounter (Signed)
Pt. Called stating she saw Vickie last week and had blood work done had low platelet count and was to be referred to Hematology at [facility] she called and they told her that she needed a referral in the system before they could schedule her. If you could put that in.

## 2019-09-21 ENCOUNTER — Telehealth: Payer: Self-pay | Admitting: Hematology and Oncology

## 2019-09-21 NOTE — Telephone Encounter (Signed)
Received a new hem referral from [name] for decreased platelets. Cassandra Hendricks cld and has been scheduled to see Dr.  Bertis Ruddy on 10/11 at 1pm. Pt aware to arrive 30 minutes early.

## 2019-09-27 ENCOUNTER — Telehealth: Payer: Self-pay | Admitting: Family Medicine

## 2019-09-27 ENCOUNTER — Other Ambulatory Visit: Payer: Self-pay | Admitting: Family Medicine

## 2019-09-27 DIAGNOSIS — I10 Essential (primary) hypertension: Secondary | ICD-10-CM

## 2019-09-27 DIAGNOSIS — M545 Low back pain: Secondary | ICD-10-CM | POA: Diagnosis not present

## 2019-09-27 MED ORDER — METOPROLOL TARTRATE 50 MG PO TABS: 50.0000 mg | ORAL_TABLET | Freq: Two times a day (BID) | ORAL | 0 refills | Status: DC

## 2019-09-27 MED ORDER — HYDROCHLOROTHIAZIDE 25 MG PO TABS: 25.0000 mg | ORAL_TABLET | Freq: Every day | ORAL | 0 refills | Status: DC

## 2019-09-27 NOTE — Telephone Encounter (Signed)
Pt states this is the 2nd time she has went to pick up her metoprolol and it was #30 so it's only for 15 days.  She would like a 90 day refill #180 pills like it used to be at [facility] and also would like her HCTZ filled for #90.

## 2019-09-27 NOTE — Telephone Encounter (Signed)
Sent in refills again

## 2019-10-12 ENCOUNTER — Telehealth: Payer: Self-pay

## 2019-10-12 ENCOUNTER — Other Ambulatory Visit: Payer: Self-pay | Admitting: Family Medicine

## 2019-10-12 DIAGNOSIS — B001 Herpesviral vesicular dermatitis: Secondary | ICD-10-CM

## 2019-10-12 NOTE — Telephone Encounter (Signed)
Received a fax from [facility] for a refill on the pts. Valacyclovir pt. Last apt was 09/15/19.

## 2019-10-13 DIAGNOSIS — M545 Low back pain, unspecified: Secondary | ICD-10-CM | POA: Diagnosis not present

## 2019-10-13 MED ORDER — VALACYCLOVIR HCL 500 MG PO TABS: 500.0000 mg | ORAL_TABLET | Freq: Every day | ORAL | 0 refills | Status: DC

## 2019-10-13 NOTE — Telephone Encounter (Signed)
Sent in med

## 2019-10-18 ENCOUNTER — Inpatient Hospital Stay: Payer: Medicare Other | Attending: Hematology and Oncology | Admitting: Hematology and Oncology

## 2019-10-18 ENCOUNTER — Other Ambulatory Visit: Payer: Self-pay

## 2019-10-18 ENCOUNTER — Encounter: Payer: Self-pay | Admitting: Hematology and Oncology

## 2019-10-18 ENCOUNTER — Telehealth: Payer: Self-pay | Admitting: Hematology and Oncology

## 2019-10-18 VITALS — BP 129/51 | HR 70 | Temp 97.5°F | Resp 18 | Ht 64.0 in | Wt 181.6 lb

## 2019-10-18 DIAGNOSIS — R238 Other skin changes: Secondary | ICD-10-CM | POA: Insufficient documentation

## 2019-10-18 DIAGNOSIS — Z87891 Personal history of nicotine dependence: Secondary | ICD-10-CM | POA: Diagnosis not present

## 2019-10-18 DIAGNOSIS — D696 Thrombocytopenia, unspecified: Secondary | ICD-10-CM | POA: Diagnosis not present

## 2019-10-18 DIAGNOSIS — Z801 Family history of malignant neoplasm of trachea, bronchus and lung: Secondary | ICD-10-CM

## 2019-10-18 DIAGNOSIS — R233 Spontaneous ecchymoses: Secondary | ICD-10-CM | POA: Insufficient documentation

## 2019-10-18 NOTE — Telephone Encounter (Signed)
Scheduled per sch msg. Gave avs and calendar  

## 2019-10-18 NOTE — Assessment & Plan Note (Signed)
I do not believe her mild thrombocytopenia is the cause of her bruising Her bruising is likely due to her medication from Celebrex and fish oil in the past Observe only for now

## 2019-10-18 NOTE — Progress Notes (Signed)
[facility] CONSULT NOTE  Patient Care Team: Avanell Shackleton, NP-C as PCP - General (Family Medicine)  CHIEF COMPLAINTS/PURPOSE OF CONSULTATION:  Chronic thrombocytopenia  HISTORY OF PRESENTING ILLNESS:  Cassandra Hendricks 69 y.o. female is here because of thrombocytopenia.  She was found to have abnormal CBC from recent blood draw According to electronic records, she has thrombocytopenia since 2020 However, her history dated back further than that and she was evaluated by a hematologist in the past in [place] She relocated to [place] in 2017 Her recent platelet count was 120,000 According to outside records which I have reviewed, her platelet count in August 10, 2013 was 125,000 On September 03, 2013, repeat platelet count was 131.  Vitamin B12 level and autoimmune screen were negative She was recommend observation All her electronic records in [place] did not review any platelet count lower than that She has recent bruising but denies bleeding, such as spontaneous epistaxis, hematuria, melena or hematochezia  The patient denies history of liver disease, exposure to heparin, history of cardiac murmur/prior cardiovascular surgery or recent new medications She denies prior blood or platelet transfusions She had several prior surgeries without excessive bleeding  MEDICAL HISTORY:  Past Medical History:  Diagnosis Date  . Chronic neck pain    per med record from [place]  . Female bladder prolapse   . Herpes labialis   . History of thrombocytopenia   . HTN (hypertension)    controlled with HCTZ and metoprolol, DASH. EKG 05/2015 NSR, no abnormalities  . Mixed hyperlipidemia    per records from 05/2015 in [place]. diet and exercise  . Osteopenia determined by x-ray    DEXA 05/30/2014 in [place]   . Stress incontinence     SURGICAL HISTORY: Past Surgical History:  Procedure Laterality Date  . BLADDER REPAIR     2014 in [place]  . REPAIR OF RECTAL PROLAPSE      2014    SOCIAL HISTORY: Social History   Socioeconomic History  . Marital status: Divorced    Spouse name: Not on file  . Number of children: 2  . Years of education: Not on file  . Highest education level: Not on file  Occupational History  . Occupation: retired from Education officer, environmental business  Tobacco Use  . Smoking status: Former Smoker    Years: 12.00    Quit date: 10/07/1992    Years since quitting: 27.0  . Smokeless tobacco: Never Used  . Tobacco comment: light smoker  Vaping Use  . Vaping Use: Never used  Substance and Sexual Activity  . Alcohol use: No  . Drug use: No  . Sexual activity: Not Currently  Other Topics Concern  . Not on file  Social History Narrative  . Not on file   Social Determinants of Health   Financial Resource Strain:   . Difficulty of Paying Living Expenses: Not on file  Food Insecurity:   . Worried About Programme researcher, broadcasting/film/video in the Last Year: Not on file  . Ran Out of Food in the Last Year: Not on file  Transportation Needs:   . Lack of Transportation (Medical): Not on file  . Lack of Transportation (Non-Medical): Not on file  Physical Activity:   . Days of Exercise per Week: Not on file  . Minutes of Exercise per Session: Not on file  Stress:   . Feeling of Stress : Not on file  Social Connections:   . Frequency of Communication with Friends and Family:  Not on file  . Frequency of Social Gatherings with Friends and Family: Not on file  . Attends Religious Services: Not on file  . Active Member of Clubs or Organizations: Not on file  . Attends Banker Meetings: Not on file  . Marital Status: Not on file  Intimate Partner Violence:   . Fear of Current or Ex-Partner: Not on file  . Emotionally Abused: Not on file  . Physically Abused: Not on file  . Sexually Abused: Not on file    FAMILY HISTORY: Family History  Problem Relation Age of Onset  . Hypertension Mother   . COPD Mother   . Thyroid disease Mother   .  Arthritis-Osteo Mother   . Diabetes Father   . Hypertension Father   . Heart attack Father 11  . Hypertension Sister   . Diabetes Sister   . Cancer Sister        lung ca  . Diabetes Brother   . Heart failure Brother   . Stroke Brother   . Thyroid disease Brother   . Diabetes Sister   . Hypertension Sister     ALLERGIES:  is allergic to codeine.  MEDICATIONS:  Current Outpatient Medications  Medication Sig Dispense Refill  . Ascorbic Acid (VITAMIN C PO) Take by mouth.    . celecoxib (CELEBREX) 200 MG capsule Take 200 mg by mouth daily.   3  . glucosamine-chondroitin 500-400 MG tablet Take 1 tablet by mouth 3 (three) times daily.    . hydrochlorothiazide (HYDRODIURIL) 25 MG tablet Take 1 tablet (25 mg total) by mouth daily. 90 tablet 0  . metoprolol tartrate (LOPRESSOR) 50 MG tablet Take 1 tablet (50 mg total) by mouth 2 (two) times daily. 180 tablet 0  . Multiple Vitamin (MULTIVITAMIN) tablet Take 1 tablet by mouth daily.    . valACYclovir (VALTREX) 500 MG tablet Take 1 tablet (500 mg total) by mouth daily. 90 tablet 0   No current facility-administered medications for this visit.    REVIEW OF SYSTEMS:   Constitutional: Denies fevers, chills or abnormal night sweats Eyes: Denies blurriness of vision, double vision or watery eyes Ears, nose, mouth, throat, and face: Denies mucositis or sore throat Respiratory: Denies cough, dyspnea or wheezes Cardiovascular: Denies palpitation, chest discomfort or lower extremity swelling Gastrointestinal:  Denies nausea, heartburn or change in bowel habits Skin: Denies abnormal skin rashes Lymphatics: Denies new lymphadenopathy Neurological:Denies numbness, tingling or new weaknesses Behavioral/Psych: Mood is stable, no new changes  All other systems were reviewed with the patient and are negative.  PHYSICAL EXAMINATION: ECOG PERFORMANCE STATUS: 0 - Asymptomatic  Vitals:   10/18/19 1303  BP: (!) 129/51  Pulse: 70  Resp: 18  Temp:  (!) 97.5 F (36.4 C)  SpO2: 99%   Filed Weights   10/18/19 1303  Weight: 181 lb 9.6 oz (82.4 kg)    GENERAL:alert, no distress and comfortable SKIN: skin color, texture, turgor are normal, no rashes or significant lesions EYES: normal, conjunctiva are pink and non-injected, sclera clear OROPHARYNX:no exudate, no erythema and lips, buccal mucosa, and tongue normal  NECK: supple, thyroid normal size, non-tender, without nodularity LYMPH:  no palpable lymphadenopathy in the cervical, axillary or inguinal LUNGS: clear to auscultation and percussion with normal breathing effort HEART: regular rate & rhythm and no murmurs and no lower extremity edema ABDOMEN:abdomen soft, non-tender and normal bowel sounds Musculoskeletal:no cyanosis of digits and no clubbing  PSYCH: alert & oriented x 3 with fluent speech NEURO: no  focal motor/sensory deficits  LABORATORY DATA:  I have reviewed the data as listed Lab Results  Component Value Date   WBC 7.5 09/15/2019   HGB 13.1 09/15/2019   HCT 35.9 09/15/2019   MCV 91 09/15/2019   PLT 120 (L) 09/15/2019    ASSESSMENT & PLAN Thrombocytopenia (HCC) 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 We discussed the risk and benefits of further investigation versus observation Given stability of her platelet count and lack of symptoms, I do not favor ordering excessive other tests because it will not change management She is comfortable with the plan Her primary care doctor is checking blood work frequently about twice a year For that reason, I will not order labs here but will continue to see her once a year I will see her back in July for further follow-up The patient is educated to watch for signs and symptoms of bleeding Her thrombocytopenia can drop in the setting of severe infection or stress I do not believe her bruising is related to her thrombocytopenia  Easy bruising I do not believe her  mild thrombocytopenia is the cause of her bruising Her bruising is likely due to her medication from Celebrex and fish oil in the past Observe only for now   No orders of the defined types were placed in this encounter.  All questions were answered. The patient knows to call the clinic with any problems, questions or concerns. No barriers to learning was detected. The total time spent in the appointment was 55 minutes encounter with patients including review of chart and various tests results, discussions about plan of care and coordination of care plan  Artis Delay, MD 10/18/2019 3:13 PM

## 2019-10-18 NOTE — Assessment & Plan Note (Signed)
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 We discussed the risk and benefits of further investigation versus observation Given stability of her platelet count and lack of symptoms, I do not favor ordering excessive other tests because it will not change management She is comfortable with the plan Her primary care doctor is checking blood work frequently about twice a year For that reason, I will not order labs here but will continue to see her once a year I will see her back in July for further follow-up The patient is educated to watch for signs and symptoms of bleeding Her thrombocytopenia can drop in the setting of severe infection or stress I do not believe her bruising is related to her thrombocytopenia

## 2019-10-19 ENCOUNTER — Other Ambulatory Visit: Payer: Self-pay | Admitting: Family Medicine

## 2019-10-19 DIAGNOSIS — L98491 Non-pressure chronic ulcer of skin of other sites limited to breakdown of skin: Secondary | ICD-10-CM

## 2019-11-02 DIAGNOSIS — M545 Low back pain, unspecified: Secondary | ICD-10-CM | POA: Diagnosis not present

## 2019-11-10 ENCOUNTER — Other Ambulatory Visit: Payer: Self-pay

## 2019-11-10 ENCOUNTER — Ambulatory Visit (INDEPENDENT_AMBULATORY_CARE_PROVIDER_SITE_OTHER): Payer: Medicare Other | Admitting: Family Medicine

## 2019-11-10 ENCOUNTER — Encounter: Payer: Self-pay | Admitting: Family Medicine

## 2019-11-10 ENCOUNTER — Ambulatory Visit
Admission: RE | Admit: 2019-11-10 | Discharge: 2019-11-10 | Disposition: A | Discharge status: Home / Self Care | Payer: Medicare Other | Source: Ambulatory Visit | Attending: Family Medicine | Admitting: Family Medicine

## 2019-11-10 VITALS — BP 130/80 | HR 79 | Temp 98.0°F | Wt 181.0 lb

## 2019-11-10 DIAGNOSIS — L03012 Cellulitis of left finger: Secondary | ICD-10-CM

## 2019-11-10 DIAGNOSIS — S6992XA Unspecified injury of left wrist, hand and finger(s), initial encounter: Secondary | ICD-10-CM

## 2019-11-10 DIAGNOSIS — S61211A Laceration without foreign body of left index finger without damage to nail, initial encounter: Secondary | ICD-10-CM | POA: Diagnosis not present

## 2019-11-10 MED ORDER — SULFAMETHOXAZOLE-TRIMETHOPRIM 800-160 MG PO TABS: 1.0000 | ORAL_TABLET | Freq: Two times a day (BID) | ORAL | 0 refills | Status: DC

## 2019-11-10 NOTE — Progress Notes (Signed)
   Subjective:    Patient ID: Cassandra Hendricks, female    DOB: 11-02-1950, 69 y.o.   MRN: [id number]  HPI Chief Complaint  Patient presents with  . cut hand    cut finger on side of house monday. finger is swollen, red feverish.    States she hit her left hand on the side of her house, the aluminum siding, 2 days ago. She cut her index finger and it bled. States she cleaned it well. Has been using Neosporin and keeping it covered. States the redness and swelling has spread to her hand now. No numbness, tingling or weakness. She is able to use her hand and fingers.  Denies hx of MRSA or diabetes.   Tdap 2018  Reviewed allergies, medications, past medical, surgical, family, and social history.     Review of Systems Pertinent positives and negatives in the history of present illness.     Objective:   Physical Exam Constitutional:      General: She is not in acute distress.    Appearance: Normal appearance. She is not ill-appearing.  Musculoskeletal:     Left wrist: Normal.     Left hand: Swelling and tenderness present. Decreased range of motion. Normal strength. Normal sensation. There is no disruption of two-point discrimination. Normal capillary refill. Normal pulse.     Comments: Erythema, edema and TTP to 2nd finger and MCP joint. She has good sensation, ROM and strength. No sign of tendon damage.   Skin:    General: Skin is warm and dry.     Capillary Refill: Capillary refill takes less than 2 seconds.  Neurological:     General: No focal deficit present.     Mental Status: She is alert.     Cranial Nerves: No cranial nerve deficit.     Sensory: No sensory deficit.     Motor: No weakness.    BP 130/80   Pulse 79   Temp 98 F (36.7 C)   Wt 181 lb (82.1 kg)   BMI 31.07 kg/m       Assessment & Plan:  Injury of left hand, initial encounter - Plan: DG Hand Complete Left  Cellulitis of finger of left hand - Plan: sulfamethoxazole-trimethoprim (BACTRIM DS)  800-160 MG tablet  I will treat her with antibiotics and send her for an x-ray. Tetanus is up-to-date. She will use ice, antibiotic ointment and keep the area covered. Follow-up with me Monday or if she has any worsening symptoms, she will need to be seen again for that.

## 2019-11-15 ENCOUNTER — Other Ambulatory Visit: Payer: Self-pay

## 2019-11-15 ENCOUNTER — Encounter: Payer: Self-pay | Admitting: Family Medicine

## 2019-11-15 ENCOUNTER — Ambulatory Visit (INDEPENDENT_AMBULATORY_CARE_PROVIDER_SITE_OTHER): Payer: Medicare Other | Admitting: Family Medicine

## 2019-11-15 VITALS — Temp 97.6°F

## 2019-11-15 DIAGNOSIS — M25642 Stiffness of left hand, not elsewhere classified: Secondary | ICD-10-CM | POA: Diagnosis not present

## 2019-11-15 DIAGNOSIS — S6992XA Unspecified injury of left wrist, hand and finger(s), initial encounter: Secondary | ICD-10-CM

## 2019-11-15 MED ORDER — MUPIROCIN 2 % EX OINT: 1.0000 "application " | TOPICAL_OINTMENT | Freq: Every day | CUTANEOUS | 0 refills | Status: DC | PRN

## 2019-11-15 NOTE — Progress Notes (Signed)
Results discussed in person.

## 2019-11-15 NOTE — Progress Notes (Signed)
° °  Subjective:    Patient ID: Cassandra Hendricks, female    DOB: [birth year], 69 y.o.   MRN: [id number]  HPI Chief Complaint  Patient presents with   follow-up on finger    follow-up on finger   She is a left hand dominant female who is here for a follow for left hand injury and early cellulitis. She was seen here on 11/10/2019 for a left hand injury that occurred on 11/08/2019.  States she hit her left hand on the side of her house, the aluminum siding. She cut her index finger over the PIP joint and it bled. States she cleaned it well. Has been using Neosporin and keeping it covered. She had cellulitis of her left hand that had spread to the mid left dorsum and this has resolved.   Negative left hand XR.  Taking Bactrim.   Tdap UTD  No fever, chills, N/V  States Dr. Lynelle Doctor prescribed for her mupirocin last year and she would like a refill.   Review of Systems Pertinent positives and negatives in the history of present illness.     Objective:   Physical Exam Temp 97.6 F (36.4 C)   Left hand without erythema or edema but she has limited ROM of the PIP joint of her left index finger. Wound healing.       Assessment & Plan:  Injury of left hand, initial encounter - Plan: Ambulatory referral to Hand Surgery, mupirocin ointment (BACTROBAN) 2 %  Decreased range of motion of finger of left hand - Plan: Ambulatory referral to Hand Surgery, mupirocin ointment (BACTROBAN) 2 %  Infection has resolved.  Complete Bactrim. I am concerned about her limited range of motion with her left index finger especially since this is her dominant hand.  Her occupation is cleaning houses.  She will continue keeping the wound covered and using gloves. Discussed referral to physical therapy versus seeing a hand specialist. We will refer her to a hand surgeon for further evaluation. She may schedule to return for her Covid booster per her convenience.

## 2019-11-16 DIAGNOSIS — M545 Low back pain, unspecified: Secondary | ICD-10-CM | POA: Diagnosis not present

## 2019-11-19 ENCOUNTER — Ambulatory Visit (INDEPENDENT_AMBULATORY_CARE_PROVIDER_SITE_OTHER): Payer: Medicare Other

## 2019-11-19 ENCOUNTER — Other Ambulatory Visit: Payer: Self-pay

## 2019-11-19 DIAGNOSIS — Z23 Encounter for immunization: Secondary | ICD-10-CM

## 2019-11-24 DIAGNOSIS — M545 Low back pain, unspecified: Secondary | ICD-10-CM | POA: Diagnosis not present

## 2019-12-16 DIAGNOSIS — M545 Low back pain, unspecified: Secondary | ICD-10-CM | POA: Diagnosis not present

## 2019-12-20 DIAGNOSIS — Z9889 Other specified postprocedural states: Secondary | ICD-10-CM | POA: Diagnosis not present

## 2019-12-20 DIAGNOSIS — M545 Low back pain, unspecified: Secondary | ICD-10-CM | POA: Diagnosis not present

## 2019-12-20 DIAGNOSIS — H43813 Vitreous degeneration, bilateral: Secondary | ICD-10-CM | POA: Diagnosis not present

## 2019-12-20 DIAGNOSIS — H25813 Combined forms of age-related cataract, bilateral: Secondary | ICD-10-CM | POA: Diagnosis not present

## 2019-12-27 DIAGNOSIS — M545 Low back pain, unspecified: Secondary | ICD-10-CM | POA: Diagnosis not present

## 2019-12-27 NOTE — Progress Notes (Signed)
Cassandra Hendricks is a 69 y.o. female who presents for annual wellness visit, CPE  and follow-up on chronic medical conditions.  She has the following concerns:  Prediabetes- Hgb A1c 6.1% in September   HTN- taking HCTZ and metoprolol without difficulty   HL- LDL 97 in 12/2018. Fairly healthy diet   Vitamin D def- taking a supplement   Thrombocytopenia- she saw hematology for this and we will continue to monitor.     Immunization History  Administered Date(s) Administered  . Fluad Quad(high Dose 65+) 09/23/2018, 09/15/2019  . Influenza Split 10/24/2015  . Influenza, High Dose Seasonal PF 11/02/2014, 10/14/2017  . Influenza,inj,Quad PF,6+ Mos 10/04/2016  . Influenza-Unspecified 10/08/2014  . PFIZER SARS-COV-2 Vaccination 03/07/2019, 03/31/2019, 11/19/2019  . Pneumococcal Conjugate-13 07/01/2017  . Pneumococcal-Unspecified 12/08/2014  . Tdap 05/11/2016  . Zoster 05/08/2015  . Zoster Recombinat (Shingrix) 07/02/2017, 10/14/2017   Last Pap smear: 2018 with Dr. Marice Potter and no history of abnormal pap smears.  Last mammogram: 03/23/19 Last colonoscopy: cologuard negative 2020  Last DEXA: 03/23/19 Dentist: November  Ophtho: Dr. Dione Booze last week  Exercise: in PT  Other doctors caring for patient include: Julien Girt - rehab  Dr. Bertis Ruddy- Hematologist Dr. Dione Booze  Dr. Cleophas Dunker- ortho   States she has 2 kids. Her daughter Efraim Kaufmann will be her HCPOA   Depression screen:  See questionnaire below.  Depression screen [facility] 2/9 12/28/2019 09/15/2019 12/15/2018 07/01/2017 05/20/2016  Decreased Interest 0 0 0 0 0  Down, Depressed, Hopeless 0 0 0 0 0  PHQ - 2 Score 0 0 0 0 0    Fall Risk Screen: see questionnaire below. Fall Risk  12/28/2019 09/15/2019 12/15/2018 09/22/2018 07/01/2017  Falls in the past year? 0 0 0 1 No  Number falls in past yr: 0 0 0 1 -  Injury with Fall? 0 0 0 1 -    ADL screen:  See questionnaire below Functional Status Survey: Is the patient deaf or have difficulty  hearing?: No Does the patient have difficulty seeing, even when wearing glasses/contacts?: Yes (hass cartacts) Does the patient have difficulty concentrating, remembering, or making decisions?: No Does the patient have difficulty walking or climbing stairs?: Yes Does the patient have difficulty dressing or bathing?: No Does the patient have difficulty doing errands alone such as visiting a doctor's office or shopping?: No   End of Life Discussion:  Patient does not have a living will and medical power of attorney. She has the forms at home and we have discussed these.   Review of Systems Constitutional: -fever, -chills, -sweats, -unexpected weight change, -anorexia, -fatigue Allergy: -sneezing, -itching, -congestion Dermatology: denies changing moles, rash, lumps, new worrisome lesions ENT: -runny nose, -ear pain, -sore throat, -hoarseness, -sinus pain, -teeth pain, -tinnitus, -hearing loss, -epistaxis Cardiology:  -chest pain, -palpitations, -edema, -orthopnea, -paroxysmal nocturnal dyspnea Respiratory: -cough, -shortness of breath, -dyspnea on exertion, -wheezing, -hemoptysis Gastroenterology: -abdominal pain, -nausea, -vomiting, -diarrhea, -constipation, -blood in stool, -changes in bowel movement, -dysphagia Hematology: -bleeding, +bruising problems (on Celebrex) Musculoskeletal: -arthralgias, -myalgias, -joint swelling, -back pain, -neck pain, -cramping, -gait changes Ophthalmology: -vision changes, -eye redness, -itching, -discharge Urology: -dysuria, -difficulty urinating, -hematuria, -urinary frequency, -urgency, -incontinence Neurology: -headache, -weakness, -tingling, -numbness, -speech abnormality, -memory loss, -falls, -dizziness Psychology:  -depressed mood, -agitation, -sleep problems    PHYSICAL EXAM:  BP 136/80   Pulse 66   Ht 5' 2.5" (1.588 m)   Wt 174 lb 12.8 oz (79.3 kg)   BMI 31.46 kg/m   General Appearance: Alert, cooperative, no  distress, appears stated  age Head: Normocephalic, without obvious abnormality, atraumatic Eyes: PERRL, conjunctiva/corneas clear, EOM's intact Ears: Normal TM's and external ear canals Nose: mask on  Throat: mask on  Neck: Supple, no lymphadenopathy; thyroid: no enlargement/tenderness/nodules; no JVD Back: Spine nontender, no curvature, ROM normal, no CVA tenderness Lungs: Clear to auscultation bilaterally without wheezes, rales or ronchi; respirations unlabored Chest Wall: No tenderness or deformity Heart: Regular rate and rhythm, S1 and S2 normal, no murmur, rub or gallop Breast Exam: declines  Abdomen: Soft, non-tender, nondistended, normoactive bowel sounds, no masses, no hepatosplenomegaly Genitalia: declines  Extremities: No clubbing, cyanosis or edema Pulses: 2+ and symmetric all extremities Skin: Skin with multiple bruises on forearms,  Lymph nodes: Cervical, supraclavicular, and axillary nodes normal Neurologic: CNII-XII intact, normal strength, sensation and gait; reflexes 2+ and symmetric throughout Psych: Normal mood, affect, hygiene and grooming.  ASSESSMENT/PLAN: Medicare annual wellness visit, subsequent -Denies any difficulty with memory, ADLs, depression and no falls.  She is functioning quite well and is in good spirits.  Medications reviewed.  Advanced directives discussed.  Routine general medical examination at a health care facility - Plan: CBC with Differential/Platelet, TSH, T4, free -Preventive health care reviewed and she does appear to be up-to-date.  Counseling on healthy lifestyle including diet and exercise.  Immunizations reviewed and updated.  Recommend regular dental and eye exams.  Discussed safety.  Primary hypertension - Plan: CBC with Differential/Platelet -Controlled.  Continue current medication.  Mixed hyperlipidemia - Plan: Lipid panel -Recommend low-fat, low-cholesterol diet and getting plenty of physical activity.  Follow-up pending lipid panel results  Vitamin D  deficiency - Plan: VITAMIN D 25 Hydroxy (Vit-D Deficiency, Fractures) -She is taking a supplement.  Check vitamin D level and follow-up.  Thrombocytopenia (HCC) - Plan: CBC with Differential/Platelet -Evaluation done by hematologist.  Continue to monitor  Prediabetes - Plan: TSH, T4, free -Recommend low-carb, low sugar diet and getting regular physical activity.  Advance directive discussed with patient -Forms provided and discussed with patient as we did last wellness visit.  Answered all questions.  She will follow-up if she feels these or if she has any questions about them.    Discussed monthly self breast exams and yearly mammograms; at least 30 minutes of aerobic activity at least 5 days/week and weight-bearing exercise 2x/week; proper sunscreen use reviewed; healthy diet, including goals of calcium and vitamin D intake and alcohol recommendations (less than or equal to 1 drink/day) reviewed; regular seatbelt use; changing batteries in smoke detectors.  Immunization recommendations discussed.  Colonoscopy recommendations reviewed   Medicare Attestation I have personally reviewed: The patient's medical and social history Their use of alcohol, tobacco or illicit drugs Their current medications and supplements The patient's functional ability including ADLs,fall risks, home safety risks, cognitive, and hearing and visual impairment Diet and physical activities Evidence for depression or mood disorders  The patient's weight, height, and BMI have been recorded in the chart.  I have made referrals, counseling, and provided education to the patient based on review of the above and I have provided the patient with a written personalized care plan for preventive services.     Hetty Blend, NP-C   12/29/2019

## 2019-12-28 ENCOUNTER — Ambulatory Visit (INDEPENDENT_AMBULATORY_CARE_PROVIDER_SITE_OTHER): Payer: Medicare Other | Admitting: Family Medicine

## 2019-12-28 ENCOUNTER — Other Ambulatory Visit: Payer: Self-pay

## 2019-12-28 ENCOUNTER — Encounter: Payer: Self-pay | Admitting: Family Medicine

## 2019-12-28 VITALS — BP 136/80 | HR 66 | Ht 62.5 in | Wt 174.8 lb

## 2019-12-28 DIAGNOSIS — Z0000 Encounter for general adult medical examination without abnormal findings: Secondary | ICD-10-CM | POA: Diagnosis not present

## 2019-12-28 DIAGNOSIS — I10 Essential (primary) hypertension: Secondary | ICD-10-CM

## 2019-12-28 DIAGNOSIS — E559 Vitamin D deficiency, unspecified: Secondary | ICD-10-CM | POA: Diagnosis not present

## 2019-12-28 DIAGNOSIS — Z7189 Other specified counseling: Secondary | ICD-10-CM | POA: Diagnosis not present

## 2019-12-28 DIAGNOSIS — D696 Thrombocytopenia, unspecified: Secondary | ICD-10-CM

## 2019-12-28 DIAGNOSIS — E782 Mixed hyperlipidemia: Secondary | ICD-10-CM | POA: Diagnosis not present

## 2019-12-28 DIAGNOSIS — R7303 Prediabetes: Secondary | ICD-10-CM

## 2019-12-28 NOTE — Patient Instructions (Signed)
°  Cassandra Hendricks , Thank you for taking time to come for your Medicare Wellness Visit. I appreciate your ongoing commitment to your health goals. Please review the following plan we discussed and let me know if I can assist you in the future.   These are the goals we discussed:  Make sure you are eating a healthy diet and getting plenty of exercise.  Continue following up with your dermatologist annually.  I also recommend regular dental and eye exams.    This is a list of the screening recommended for you and due dates:  Health Maintenance  Topic Date Due   Colon Cancer Screening  Never done   COVID-19 Vaccine (4 - Booster for Pfizer series) 05/18/2020   Mammogram  03/22/2021   Tetanus Vaccine  05/12/2026   Flu Shot  Completed   DEXA scan (bone density measurement)  Completed    Hepatitis C: One time screening is recommended by Center for Disease Control  (CDC) for  adults born from [birth year] through 1966.   Completed   Pneumonia vaccines  Completed

## 2019-12-29 LAB — CBC WITH DIFFERENTIAL/PLATELET
Basophils Absolute: 0.1 10*3/uL (ref 0.0–0.2)
Basos: 1 %
EOS (ABSOLUTE): 0.1 10*3/uL (ref 0.0–0.4)
Eos: 2 %
Hematocrit: 41 % (ref 34.0–46.6)
Hemoglobin: 14 g/dL (ref 11.1–15.9)
Immature Grans (Abs): 0 10*3/uL (ref 0.0–0.1)
Immature Granulocytes: 0 %
Lymphocytes Absolute: 2.5 10*3/uL (ref 0.7–3.1)
Lymphs: 31 %
MCH: 30.9 pg (ref 26.6–33.0)
MCHC: 34.1 g/dL (ref 31.5–35.7)
MCV: 91 fL (ref 79–97)
Monocytes Absolute: 0.6 10*3/uL (ref 0.1–0.9)
Monocytes: 7 %
Neutrophils Absolute: 4.7 10*3/uL (ref 1.4–7.0)
Neutrophils: 59 %
Platelets: 158 10*3/uL (ref 150–450)
RBC: 4.53 x10E6/uL (ref 3.77–5.28)
RDW: 12.4 % (ref 11.7–15.4)
WBC: 8 10*3/uL (ref 3.4–10.8)

## 2019-12-29 LAB — LIPID PANEL
Chol/HDL Ratio: 3.1 ratio (ref 0.0–4.4)
Cholesterol, Total: 182 mg/dL (ref 100–199)
HDL: 58 mg/dL (ref >39)
LDL Chol Calc (NIH): 106 mg/dL — ABNORMAL HIGH (ref 0–99)
Triglycerides: 98 mg/dL (ref 0–149)
VLDL Cholesterol Cal: 18 mg/dL (ref 5–40)

## 2019-12-29 LAB — VITAMIN D 25 HYDROXY (VIT D DEFICIENCY, FRACTURES): Vit D, 25-Hydroxy: 108 ng/mL — ABNORMAL HIGH (ref 30.0–100.0)

## 2019-12-29 LAB — TSH: TSH: 1.66 u[IU]/mL (ref 0.450–4.500)

## 2019-12-29 LAB — T4, FREE: Free T4: 1.15 ng/dL (ref 0.82–1.77)

## 2019-12-29 NOTE — Progress Notes (Signed)
Her vitamin D level is too high and can cause issues if she continues taking a very high dose. Please find out exactly how much she is taking (in all vitamins and supplements) and have her hold off on any vitamin D supplement for a couple of weeks and then start back on 1,000 IUs daily of vitamin D3. Follow up with me in 8 weeks.

## 2020-01-09 ENCOUNTER — Other Ambulatory Visit: Payer: Self-pay | Admitting: Family Medicine

## 2020-01-09 DIAGNOSIS — I10 Essential (primary) hypertension: Secondary | ICD-10-CM

## 2020-01-09 DIAGNOSIS — B001 Herpesviral vesicular dermatitis: Secondary | ICD-10-CM

## 2020-02-01 DIAGNOSIS — M545 Low back pain, unspecified: Secondary | ICD-10-CM | POA: Diagnosis not present

## 2020-02-10 ENCOUNTER — Other Ambulatory Visit: Payer: Self-pay | Admitting: Family Medicine

## 2020-02-10 DIAGNOSIS — Z1231 Encounter for screening mammogram for malignant neoplasm of breast: Secondary | ICD-10-CM

## 2020-02-16 DIAGNOSIS — M545 Low back pain, unspecified: Secondary | ICD-10-CM | POA: Diagnosis not present

## 2020-02-21 DIAGNOSIS — M545 Low back pain, unspecified: Secondary | ICD-10-CM | POA: Diagnosis not present

## 2020-02-23 ENCOUNTER — Ambulatory Visit: Payer: Medicare Other | Admitting: Family Medicine

## 2020-02-27 DIAGNOSIS — H6502 Acute serous otitis media, left ear: Secondary | ICD-10-CM | POA: Diagnosis not present

## 2020-02-29 ENCOUNTER — Ambulatory Visit: Payer: Medicare Other | Admitting: Family Medicine

## 2020-03-07 DIAGNOSIS — M545 Low back pain, unspecified: Secondary | ICD-10-CM | POA: Diagnosis not present

## 2020-03-13 DIAGNOSIS — M545 Low back pain, unspecified: Secondary | ICD-10-CM | POA: Diagnosis not present

## 2020-03-14 ENCOUNTER — Other Ambulatory Visit: Payer: Self-pay

## 2020-03-14 ENCOUNTER — Ambulatory Visit (INDEPENDENT_AMBULATORY_CARE_PROVIDER_SITE_OTHER): Payer: Medicare Other | Admitting: Family Medicine

## 2020-03-14 ENCOUNTER — Encounter: Payer: Self-pay | Admitting: Family Medicine

## 2020-03-14 VITALS — BP 118/74 | HR 56 | Temp 97.5°F | Wt 179.6 lb

## 2020-03-14 DIAGNOSIS — R7303 Prediabetes: Secondary | ICD-10-CM

## 2020-03-14 DIAGNOSIS — E673 Hypervitaminosis D: Secondary | ICD-10-CM | POA: Diagnosis not present

## 2020-03-14 DIAGNOSIS — I10 Essential (primary) hypertension: Secondary | ICD-10-CM

## 2020-03-14 MED ORDER — METOPROLOL TARTRATE 50 MG PO TABS: 50.0000 mg | ORAL_TABLET | Freq: Two times a day (BID) | ORAL | 1 refills | Status: DC

## 2020-03-14 MED ORDER — HYDROCHLOROTHIAZIDE 25 MG PO TABS: 25.0000 mg | ORAL_TABLET | Freq: Every day | ORAL | 1 refills | Status: DC

## 2020-03-14 NOTE — Progress Notes (Signed)
   Subjective:    Patient ID: Cassandra Hendricks, female    DOB: 18-May-1950, 70 y.o.   MRN: [id number]  HPI Chief Complaint  Patient presents with  . follow up on vitamin d    She is here to follow up on high vitamin D.  States she cut her vitamin D supplement in half.  She is still not sure which dose she is taking.  States she is only taking her supplement every other day now.  Would like to have her vitamin D level checked.  Hypertension-request refills.  Prediabetes-reports doing better with her diet  No new concerns today.  Denies fever, chills, dizziness, chest pain, palpitations, shortness of breath, abdominal pain, N/V/D, urinary symptoms, LE edema.     Review of Systems Pertinent positives and negatives in the history of present illness.     Objective:   Physical Exam BP 118/74   Pulse (!) 56   Temp (!) 97.5 F (36.4 C)   Wt 179 lb 9.6 oz (81.5 kg)   SpO2 98%   BMI 32.33 kg/m   Alert and oriented and in no acute distress.  Respirations unlabored.  Normal speech, memory and mood.      Assessment & Plan:  High vitamin D level - Plan: VITAMIN D 25 Hydroxy (Vit-D Deficiency, Fractures)  Prediabetes - Plan: Hemoglobin A1c  Primary hypertension - Plan: CBC with Differential/Platelet, Comprehensive metabolic panel, metoprolol tartrate (LOPRESSOR) 50 MG tablet, hydrochlorothiazide (HYDRODIURIL) 25 MG tablet  Unclear as to what her current vitamin D supplement dose is.  I will check her vitamin D level and follow-up.  She will let me know what her current dose of vitamin D is so that we can change as appropriate. She is aware that she has prediabetes.  Recommend low sugar, low-carb diet and increasing physical activity.  I will check her hemoglobin A1c and follow-up. Blood pressure well controlled.  Continue current medication regimen.

## 2020-03-15 ENCOUNTER — Other Ambulatory Visit: Payer: Self-pay | Admitting: Family Medicine

## 2020-03-15 LAB — COMPREHENSIVE METABOLIC PANEL
ALT: 27 IU/L (ref 0–32)
AST: 24 IU/L (ref 0–40)
Albumin/Globulin Ratio: 1.9 (ref 1.2–2.2)
Albumin: 4.3 g/dL (ref 3.7–4.7)
Alkaline Phosphatase: 70 IU/L (ref 44–121)
BUN/Creatinine Ratio: 34 — ABNORMAL HIGH (ref 12–28)
BUN: 23 mg/dL (ref 8–27)
Bilirubin Total: 0.3 mg/dL (ref 0.0–1.2)
CO2: 25 mmol/L (ref 20–29)
Calcium: 9.8 mg/dL (ref 8.7–10.3)
Chloride: 104 mmol/L (ref 96–106)
Creatinine, Ser: 0.67 mg/dL (ref 0.57–1.00)
Globulin, Total: 2.3 g/dL (ref 1.5–4.5)
Glucose: 87 mg/dL (ref 65–99)
Potassium: 4.7 mmol/L (ref 3.5–5.2)
Sodium: 142 mmol/L (ref 134–144)
Total Protein: 6.6 g/dL (ref 6.0–8.5)
eGFR: 93 mL/min/{1.73_m2} (ref >59)

## 2020-03-15 LAB — CBC WITH DIFFERENTIAL/PLATELET
Basophils Absolute: 0.1 10*3/uL (ref 0.0–0.2)
Basos: 1 %
EOS (ABSOLUTE): 0.2 10*3/uL (ref 0.0–0.4)
Eos: 3 %
Hematocrit: 40.8 % (ref 34.0–46.6)
Hemoglobin: 13.8 g/dL (ref 11.1–15.9)
Immature Grans (Abs): 0 10*3/uL (ref 0.0–0.1)
Immature Granulocytes: 0 %
Lymphocytes Absolute: 2.6 10*3/uL (ref 0.7–3.1)
Lymphs: 31 %
MCH: 31.3 pg (ref 26.6–33.0)
MCHC: 33.8 g/dL (ref 31.5–35.7)
MCV: 93 fL (ref 79–97)
Monocytes Absolute: 0.7 10*3/uL (ref 0.1–0.9)
Monocytes: 8 %
Neutrophils Absolute: 4.7 10*3/uL (ref 1.4–7.0)
Neutrophils: 57 %
Platelets: 142 10*3/uL — ABNORMAL LOW (ref 150–450)
RBC: 4.41 x10E6/uL (ref 3.77–5.28)
RDW: 11.9 % (ref 11.7–15.4)
WBC: 8.2 10*3/uL (ref 3.4–10.8)

## 2020-03-15 LAB — HEMOGLOBIN A1C
Est. average glucose Bld gHb Est-mCnc: 131 mg/dL
Hgb A1c MFr Bld: 6.2 % — ABNORMAL HIGH (ref 4.8–5.6)

## 2020-03-15 LAB — VITAMIN D 25 HYDROXY (VIT D DEFICIENCY, FRACTURES): Vit D, 25-Hydroxy: 100 ng/mL (ref 30.0–100.0)

## 2020-03-15 MED ORDER — CELECOXIB 200 MG PO CAPS: 200.0000 mg | ORAL_CAPSULE | Freq: Every day | ORAL | 1 refills | Status: DC

## 2020-03-15 NOTE — Progress Notes (Signed)
I would like to send her for an abdominal US to look at her spleen and liver because of her low platelets. Is she ok with this?

## 2020-03-15 NOTE — Progress Notes (Signed)
Her vitamin D is still 100. How much is she taking. Cut the current dose in half and please put in the amount she is to start taking in her medication list. Thanks. She is still in the prediabetic range with a Hgb A1c 6.2%

## 2020-03-16 NOTE — Progress Notes (Signed)
Taking a multivitamin and eating a well balanced diet with green leafy vegetables is good. The reason I want to get the [place] is to see if her spleen or liver have any abnormalities to suggest why her platelets are low. We cannot fix the problem unless we rule out or in possible causes. We just monitor if she does not want the [place] for sure.

## 2020-03-21 DIAGNOSIS — M545 Low back pain, unspecified: Secondary | ICD-10-CM | POA: Diagnosis not present

## 2020-03-28 DIAGNOSIS — M545 Low back pain, unspecified: Secondary | ICD-10-CM | POA: Diagnosis not present

## 2020-04-03 ENCOUNTER — Ambulatory Visit
Admission: RE | Admit: 2020-04-03 | Discharge: 2020-04-03 | Disposition: A | Discharge status: Home / Self Care | Payer: Medicare Other | Source: Ambulatory Visit | Attending: Family Medicine | Admitting: Family Medicine

## 2020-04-03 ENCOUNTER — Other Ambulatory Visit: Payer: Self-pay

## 2020-04-03 DIAGNOSIS — Z1231 Encounter for screening mammogram for malignant neoplasm of breast: Secondary | ICD-10-CM | POA: Diagnosis not present

## 2020-04-06 ENCOUNTER — Other Ambulatory Visit: Payer: Self-pay | Admitting: Family Medicine

## 2020-04-06 DIAGNOSIS — B001 Herpesviral vesicular dermatitis: Secondary | ICD-10-CM

## 2020-04-06 NOTE — Telephone Encounter (Signed)
Is this okay to refill? 

## 2020-04-18 DIAGNOSIS — M545 Low back pain, unspecified: Secondary | ICD-10-CM | POA: Diagnosis not present

## 2020-05-02 DIAGNOSIS — M545 Low back pain, unspecified: Secondary | ICD-10-CM | POA: Diagnosis not present

## 2020-05-03 ENCOUNTER — Other Ambulatory Visit: Payer: Self-pay | Admitting: Family Medicine

## 2020-05-03 DIAGNOSIS — I10 Essential (primary) hypertension: Secondary | ICD-10-CM

## 2020-05-09 DIAGNOSIS — M545 Low back pain, unspecified: Secondary | ICD-10-CM | POA: Diagnosis not present

## 2020-05-16 DIAGNOSIS — M545 Low back pain, unspecified: Secondary | ICD-10-CM | POA: Diagnosis not present

## 2020-05-23 DIAGNOSIS — M545 Low back pain, unspecified: Secondary | ICD-10-CM | POA: Diagnosis not present

## 2020-05-29 ENCOUNTER — Telehealth: Payer: Self-pay | Admitting: Hematology and Oncology

## 2020-05-29 NOTE — Telephone Encounter (Signed)
Rescheduled appointment per 05/16 schedule message. Patient is aware. 

## 2020-05-30 DIAGNOSIS — M545 Low back pain, unspecified: Secondary | ICD-10-CM | POA: Diagnosis not present

## 2020-06-06 DIAGNOSIS — M545 Low back pain, unspecified: Secondary | ICD-10-CM | POA: Diagnosis not present

## 2020-06-13 DIAGNOSIS — M545 Low back pain, unspecified: Secondary | ICD-10-CM | POA: Diagnosis not present

## 2020-06-20 DIAGNOSIS — M545 Low back pain, unspecified: Secondary | ICD-10-CM | POA: Diagnosis not present

## 2020-06-27 DIAGNOSIS — M545 Low back pain, unspecified: Secondary | ICD-10-CM | POA: Diagnosis not present

## 2020-07-02 ENCOUNTER — Other Ambulatory Visit: Payer: Self-pay | Admitting: Family Medicine

## 2020-07-02 DIAGNOSIS — I10 Essential (primary) hypertension: Secondary | ICD-10-CM

## 2020-07-04 DIAGNOSIS — M545 Low back pain, unspecified: Secondary | ICD-10-CM | POA: Diagnosis not present

## 2020-07-06 ENCOUNTER — Other Ambulatory Visit: Payer: Self-pay | Admitting: Family Medicine

## 2020-07-06 DIAGNOSIS — B001 Herpesviral vesicular dermatitis: Secondary | ICD-10-CM

## 2020-07-18 DIAGNOSIS — M545 Low back pain, unspecified: Secondary | ICD-10-CM | POA: Diagnosis not present

## 2020-07-25 ENCOUNTER — Ambulatory Visit: Payer: Medicare Other | Admitting: Hematology and Oncology

## 2020-08-01 ENCOUNTER — Ambulatory Visit: Payer: Medicare Other | Admitting: Hematology and Oncology

## 2020-08-02 DIAGNOSIS — M545 Low back pain, unspecified: Secondary | ICD-10-CM | POA: Diagnosis not present

## 2020-08-07 ENCOUNTER — Ambulatory Visit: Payer: Medicare Other | Admitting: Hematology and Oncology

## 2020-08-08 DIAGNOSIS — M545 Low back pain, unspecified: Secondary | ICD-10-CM | POA: Diagnosis not present

## 2020-08-13 NOTE — Progress Notes (Signed)
Chief Complaint  Patient presents with   Prediabetes    Fasting (has breakfast at 7:30am) med check. No new concerns. Would like covid booster but would like to do on a Friday. Would like a 90 rx on metoprolol today #180.    Patient presents for routine follow-up, per Vickie.  She is under her care for pre-diabetes, hypertension, and has been noted to have elevated vitamin D levels.  She is due for fasting labs today, per Vickie's notes.  Hypertension follow-up:  Patient reports compliance with metoprolol 50mg  BID and HCTZ 25mg  daily. Blood pressures are not monitored elsewhere.   Denies dizziness, headaches, chest pain.  Denies side effects of medications, no edema, no muscle cramps. BP Readings from Last 3 Encounters:  08/14/20 130/80  03/14/20 118/74  12/28/19 136/80    H/o high vitamin D levels.  Last level was 100 in 03/2020 (had been 108 in 12/2019). She is no longer taking any vitamin D apart from her daily MVI.  Prediabetes:  Recently she has been eating ice cream, or else eats Vanilla Light & Fit yogurt with some granola. Lately has been having a lot of fruit She is active in her garden, and helps cares for her sister-in-law (cleaning her house). No other regular exercise.  Lab Results  Component Value Date   HGBA1C 6.2 (H) 03/14/2020   Thrombocytopenia:  This has been mild.  Abdominal 01/2020 has been offered by Vickie (to evaluate spleen--patient reports she had this done in 05/14/2020 in the past), but declined.  Review of chart shows that levels are only mildly low, not progressive.  She saw Dr. 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 It was felt that given stability of her platelet count and lack of symptoms, no additional investigations were needed, just monitoring 2x/year.  She was to f/u with hematologist in July, looks like this was cancelled/rescheduled/cancelled.  Doesn't have f/u scheduled currently.  Lab Results  Component  Value Date   WBC 8.2 03/14/2020   HGB 13.8 03/14/2020   HCT 40.8 03/14/2020   MCV 93 03/14/2020   PLT 142 (L) 03/14/2020    PMH, PSH, SH reviewed  Outpatient Encounter Medications as of 08/14/2020  Medication Sig Note   Ascorbic Acid (VITAMIN C PO) Take by mouth.    Calcium Carbonate (CALCIUM 500 PO) Take 1 tablet by mouth daily.    celecoxib (CELEBREX) 200 MG capsule Take 1 capsule (200 mg total) by mouth daily. 08/14/2020: Takes prn, about 2-3x/week   glucosamine-chondroitin 500-400 MG tablet Take 1 tablet by mouth 3 (three) times daily.    hydrochlorothiazide (HYDRODIURIL) 25 MG tablet Take 1 tablet by mouth once daily    metoprolol tartrate (LOPRESSOR) 50 MG tablet Take 1 tablet by mouth twice daily    Multiple Vitamin (MULTIVITAMIN) tablet Take 1 tablet by mouth daily.    valACYclovir (VALTREX) 500 MG tablet Take 1 tablet by mouth once daily    [DISCONTINUED] VITAMIN D PO Take by mouth every other day.    No facility-administered encounter medications on file as of 08/14/2020.   Allergies  Allergen Reactions   Codeine Nausea And Vomiting    ROS: Denies fever, chills, URI symptoms, headaches, dizziness, shortness of breath, chest pain.  Denies edema, muscle cramps. Denies nausea, vomiting, bowel changes, urinary complaints, bleeding, rash. Easy bruising on her L forearm. Some knee pain, and occ back pain (prior work injury)   PHYSICAL EXAM:  BP 130/80  Pulse 72   Ht 5' 2.5" (1.588 m)   Wt 178 lb 12.8 oz (81.1 kg)   BMI 32.18 kg/m   Wt Readings from Last 3 Encounters:  08/14/20 178 lb 12.8 oz (81.1 kg)  03/14/20 179 lb 9.6 oz (81.5 kg)  12/28/19 174 lb 12.8 oz (79.3 kg)    Well developed, well nourished patient, in no distress HEENT: conjunctiva and sclera are clear, EOMI. Wearing mask Neck: No lymphadenopathy or thyromegaly, no carotid bruit Heart:  Regular rate and rhythm, no murmur, rub or ectopy Lungs:  Clear bilaterally, without wheezes, rales or  ronchi Abdomen:  Soft, nontender, nondistended, no hepatosplenomegaly or masses, normal bowel sounds Extremities:  No clubbing, cyanosis or edema, 2+ pulses.  Neuro:  Alert and oriented x 3. Normal gait. Back:  No CVA tenderness. Mildly tender at mid-thoracic spine.  No muscle spasm. Skin: no rashes or suspicious lesions. She has some bruising and purpura on L forearm Psych:  Normal mood, affect, hygiene and grooming, normal speech, eye contact   Lab Results  Component Value Date   HGBA1C 6.4 (A) 08/14/2020     ASSESSMENT/PLAN:  Essential hypertension - controlled on current regimen. Encouraged regular aerobic exercise and wt loss - Plan: Comprehensive metabolic panel, metoprolol tartrate (LOPRESSOR) 50 MG tablet  Thrombocytopenia (HCC) - mild/stable - Plan: CBC with Differential/Platelet  High vitamin D level - now off supplements, just taking MVI, due for recheck - Plan: VITAMIN D 25 Hydroxy (Vit-D Deficiency, Fractures)  Mixed hyperlipidemia - not on meds. due for check - Plan: Lipid panel  Prediabetes - A1c higher.  Reviewed proper diet, exercise, wt loss, discussed insulin resistance - Plan: HgB A1c  Medication monitoring encounter - Plan: Comprehensive metabolic panel, CBC with Differential/Platelet, VITAMIN D 25 Hydroxy (Vit-D Deficiency, Fractures)  BMI 32.0-32.9,adult - counseled re: diet, exercise, wt loss  Senile purpura (HCC) - upper extremities. Doubt plts are contributing, but due to be checked   Encouraged COVID booster (Plans to schedule when convenient).  Forward labs to Dr. Bertis Ruddy (pt to rescheddule visit).  F/u for CPE/AWV with Vickie for 01/2021  Letter with labs (phone call if abnl).

## 2020-08-14 ENCOUNTER — Other Ambulatory Visit: Payer: Self-pay

## 2020-08-14 ENCOUNTER — Ambulatory Visit (INDEPENDENT_AMBULATORY_CARE_PROVIDER_SITE_OTHER): Payer: Medicare Other | Admitting: Family Medicine

## 2020-08-14 ENCOUNTER — Ambulatory Visit: Payer: Medicare Other | Admitting: Family Medicine

## 2020-08-14 ENCOUNTER — Encounter: Payer: Self-pay | Admitting: Family Medicine

## 2020-08-14 VITALS — BP 130/80 | HR 72 | Ht 62.5 in | Wt 178.8 lb

## 2020-08-14 DIAGNOSIS — E673 Hypervitaminosis D: Secondary | ICD-10-CM

## 2020-08-14 DIAGNOSIS — R7303 Prediabetes: Secondary | ICD-10-CM | POA: Diagnosis not present

## 2020-08-14 DIAGNOSIS — Z5181 Encounter for therapeutic drug level monitoring: Secondary | ICD-10-CM | POA: Diagnosis not present

## 2020-08-14 DIAGNOSIS — D696 Thrombocytopenia, unspecified: Secondary | ICD-10-CM

## 2020-08-14 DIAGNOSIS — E782 Mixed hyperlipidemia: Secondary | ICD-10-CM

## 2020-08-14 DIAGNOSIS — I10 Essential (primary) hypertension: Secondary | ICD-10-CM | POA: Diagnosis not present

## 2020-08-14 DIAGNOSIS — D692 Other nonthrombocytopenic purpura: Secondary | ICD-10-CM | POA: Diagnosis not present

## 2020-08-14 DIAGNOSIS — R7989 Other specified abnormal findings of blood chemistry: Secondary | ICD-10-CM | POA: Diagnosis not present

## 2020-08-14 DIAGNOSIS — T452X1A Poisoning by vitamins, accidental (unintentional), initial encounter: Secondary | ICD-10-CM | POA: Diagnosis not present

## 2020-08-14 DIAGNOSIS — Z6832 Body mass index (BMI) 32.0-32.9, adult: Secondary | ICD-10-CM

## 2020-08-14 LAB — POCT GLYCOSYLATED HEMOGLOBIN (HGB A1C): Hemoglobin A1C: 6.4 % — AB (ref 4.0–5.6)

## 2020-08-14 MED ORDER — METOPROLOL TARTRATE 50 MG PO TABS: 50.0000 mg | ORAL_TABLET | Freq: Two times a day (BID) | ORAL | 1 refills | Status: DC

## 2020-08-14 NOTE — Patient Instructions (Addendum)
Try and exercise daily, lose some weight, and limit the sugars in your diet (from excess fruit, ice cream and carbs in your diet). You are closer to developing diabetes (A1c was 6.4% today, diabetes is when it gets up to 6.5%).  Try and get 150 minutes/week (30 minutes/day 5 days/week, and can be split up to 10-15 minute intervals, if needed) of aerobic exercise.  If for some reason you can't set up a COVID booster at your convenience at our office, you can get one through the pharmacy when it is convenient for you.

## 2020-08-15 DIAGNOSIS — M545 Low back pain, unspecified: Secondary | ICD-10-CM | POA: Diagnosis not present

## 2020-08-16 ENCOUNTER — Encounter: Payer: Self-pay | Admitting: Family Medicine

## 2020-08-16 LAB — CBC WITH DIFFERENTIAL/PLATELET
Basophils Absolute: 0.1 10*3/uL (ref 0.0–0.2)
Basos: 1 %
EOS (ABSOLUTE): 0.1 10*3/uL (ref 0.0–0.4)
Eos: 1 %
Hematocrit: 41.8 % (ref 34.0–46.6)
Hemoglobin: 14.5 g/dL (ref 11.1–15.9)
Immature Grans (Abs): 0 10*3/uL (ref 0.0–0.1)
Immature Granulocytes: 0 %
Lymphocytes Absolute: 2.2 10*3/uL (ref 0.7–3.1)
Lymphs: 30 %
MCH: 31.3 pg (ref 26.6–33.0)
MCHC: 34.7 g/dL (ref 31.5–35.7)
MCV: 90 fL (ref 79–97)
Monocytes Absolute: 0.4 10*3/uL (ref 0.1–0.9)
Monocytes: 6 %
Neutrophils Absolute: 4.5 10*3/uL (ref 1.4–7.0)
Neutrophils: 62 %
Platelets: 148 10*3/uL — ABNORMAL LOW (ref 150–450)
RBC: 4.64 x10E6/uL (ref 3.77–5.28)
RDW: 12.3 % (ref 11.7–15.4)
WBC: 7.2 10*3/uL (ref 3.4–10.8)

## 2020-08-16 LAB — LIPID PANEL
Chol/HDL Ratio: 3.3 ratio (ref 0.0–4.4)
Cholesterol, Total: 193 mg/dL (ref 100–199)
HDL: 58 mg/dL (ref >39)
LDL Chol Calc (NIH): 120 mg/dL — ABNORMAL HIGH (ref 0–99)
Triglycerides: 81 mg/dL (ref 0–149)
VLDL Cholesterol Cal: 15 mg/dL (ref 5–40)

## 2020-08-16 LAB — COMPREHENSIVE METABOLIC PANEL
ALT: 21 IU/L (ref 0–32)
AST: 24 IU/L (ref 0–40)
Albumin/Globulin Ratio: 1.9 (ref 1.2–2.2)
Albumin: 4.4 g/dL (ref 3.7–4.7)
Alkaline Phosphatase: 78 IU/L (ref 44–121)
BUN/Creatinine Ratio: 29 — ABNORMAL HIGH (ref 12–28)
BUN: 18 mg/dL (ref 8–27)
Bilirubin Total: 0.4 mg/dL (ref 0.0–1.2)
CO2: 25 mmol/L (ref 20–29)
Calcium: 9.8 mg/dL (ref 8.7–10.3)
Chloride: 100 mmol/L (ref 96–106)
Creatinine, Ser: 0.63 mg/dL (ref 0.57–1.00)
Globulin, Total: 2.3 g/dL (ref 1.5–4.5)
Glucose: 92 mg/dL (ref 65–99)
Potassium: 4.3 mmol/L (ref 3.5–5.2)
Sodium: 142 mmol/L (ref 134–144)
Total Protein: 6.7 g/dL (ref 6.0–8.5)
eGFR: 95 mL/min/{1.73_m2} (ref >59)

## 2020-08-16 LAB — VITAMIN D 25 HYDROXY (VIT D DEFICIENCY, FRACTURES): Vit D, 25-Hydroxy: 78.7 ng/mL (ref 30.0–100.0)

## 2020-08-21 DIAGNOSIS — L259 Unspecified contact dermatitis, unspecified cause: Secondary | ICD-10-CM | POA: Diagnosis not present

## 2020-08-29 DIAGNOSIS — M545 Low back pain, unspecified: Secondary | ICD-10-CM | POA: Diagnosis not present

## 2020-09-05 ENCOUNTER — Other Ambulatory Visit: Payer: Self-pay

## 2020-09-05 ENCOUNTER — Encounter: Payer: Self-pay | Admitting: Family Medicine

## 2020-09-05 ENCOUNTER — Telehealth: Payer: Self-pay | Admitting: Family Medicine

## 2020-09-05 ENCOUNTER — Telehealth (INDEPENDENT_AMBULATORY_CARE_PROVIDER_SITE_OTHER): Payer: Medicare Other | Admitting: Family Medicine

## 2020-09-05 VITALS — Temp 100.5°F | Wt 173.0 lb

## 2020-09-05 DIAGNOSIS — U071 COVID-19: Secondary | ICD-10-CM

## 2020-09-05 MED ORDER — NIRMATRELVIR/RITONAVIR (PAXLOVID)TABLET: 3.0000 | ORAL_TABLET | Freq: Two times a day (BID) | ORAL | 0 refills | Status: AC

## 2020-09-05 NOTE — Addendum Note (Signed)
Addended by: Avanell Shackleton on: 09/05/2020 03:56 PM   Modules accepted: Orders

## 2020-09-05 NOTE — Telephone Encounter (Signed)
She does want rx called in to  [facility]

## 2020-09-05 NOTE — Progress Notes (Addendum)
   Subjective:  Documentation for virtual telephone encounter. Attempted virtual video unsuccessfully on patient's end.    The patient was located at home. The provider was located in the office. The patient did consent to this visit and is aware of possible charges through their insurance for this visit.  The other persons participating in this telemedicine service were none. Time spent on call was 18 minutes and in review of previous records 21 minutes total.  This virtual service is not related to other E/M service within previous 7 days.  99441 (5-67min) 18841 (11-40min) 66063 (21-9min)   Patient ID: Cassandra Hendricks, female    DOB: 28-Aug-1950, 70 y.o.   MRN: [id number]  HPI Chief Complaint  Patient presents with   other    Positive for covid yesterday, congestion, fever ST,    Complains of a 2 day history of sore throat, headache, nasal congestion, ear discomfort, fever, chills, and fatigue.  Reports testing positive for Covid yesterday at home.   Taking Tylenol.   Hx of Covid in 2020.   Covid vaccines - 3 so far.   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   Denies dizziness, chest pain, palpitations, shortness of breath, abdominal pain, N/V/D, LE edema.   Reviewed allergies, medications, past medical, surgical, family, and social history.   Review of Systems Pertinent positives and negatives in the history of present illness.     Objective:   Physical Exam Temp (!) 100.5 F (38.1 C)   Wt 173 lb (78.5 kg)   BMI 31.14 kg/m   Alert and oriented and in no acute distress.  Speaking in complete sentences without difficulty.  Normal speech, mood and thought process.      Assessment & Plan:   COVID-19 virus infection  Discussed symptomatic treatment and offered Paxlovid.  Discussed how to take the medication, potential side effects and the fact that she must start it within 5 days of onset of symptoms.  She  declines at this point.  She is aware that she can call back in the next day or 2 if she is worsening and we can start on the antiviral medication.  Discussed red flag symptoms and when to seek medical attention. Patient called back and would like to start Paxlovid. Medication sent.

## 2020-09-06 ENCOUNTER — Telehealth: Payer: Self-pay | Admitting: Family Medicine

## 2020-09-06 NOTE — Telephone Encounter (Signed)
Pt called and states that her right side of her back is hurting at her lungs,and it hurts when she coughs, She just took her first dose of the covid medicine, Also states that she still has a headache sore throat ears feel funny has snotty nose, She wants to know if you need to listen to her breathing,  Pt can be reached at [phone]

## 2020-09-19 DIAGNOSIS — M545 Low back pain, unspecified: Secondary | ICD-10-CM | POA: Diagnosis not present

## 2020-09-26 DIAGNOSIS — M545 Low back pain, unspecified: Secondary | ICD-10-CM | POA: Diagnosis not present

## 2020-10-01 ENCOUNTER — Other Ambulatory Visit: Payer: Self-pay | Admitting: Family Medicine

## 2020-10-01 DIAGNOSIS — I10 Essential (primary) hypertension: Secondary | ICD-10-CM

## 2020-10-03 ENCOUNTER — Other Ambulatory Visit: Payer: Self-pay | Admitting: Family Medicine

## 2020-10-03 DIAGNOSIS — B001 Herpesviral vesicular dermatitis: Secondary | ICD-10-CM

## 2020-10-03 DIAGNOSIS — M545 Low back pain, unspecified: Secondary | ICD-10-CM | POA: Diagnosis not present

## 2020-10-03 DIAGNOSIS — I10 Essential (primary) hypertension: Secondary | ICD-10-CM

## 2020-10-10 DIAGNOSIS — M545 Low back pain, unspecified: Secondary | ICD-10-CM | POA: Diagnosis not present

## 2020-10-16 ENCOUNTER — Other Ambulatory Visit: Payer: Self-pay

## 2020-10-16 ENCOUNTER — Telehealth: Payer: Self-pay | Admitting: Internal Medicine

## 2020-10-16 ENCOUNTER — Other Ambulatory Visit (INDEPENDENT_AMBULATORY_CARE_PROVIDER_SITE_OTHER): Payer: Medicare Other

## 2020-10-16 DIAGNOSIS — Z23 Encounter for immunization: Secondary | ICD-10-CM

## 2020-10-16 NOTE — Telephone Encounter (Signed)
Patient came in for a flu shot today and I advised her that vickie was leaving our office. She asked if she could start seeing you. 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

## 2020-10-16 NOTE — Telephone Encounter (Signed)
Pt was notified and ok with it. She just wants to see a female.

## 2020-10-16 NOTE — Telephone Encounter (Signed)
We should be advising patients that they are to see the new provider, but we are happy to continue her care until one is hired.  She is due for a CPE in January, and I likely cannot accommodate that in my schedule.  If she isn't happy with new provider, we can re-address this.

## 2020-10-18 ENCOUNTER — Encounter: Payer: Self-pay | Admitting: Family Medicine

## 2020-10-24 DIAGNOSIS — M545 Low back pain, unspecified: Secondary | ICD-10-CM | POA: Diagnosis not present

## 2020-10-31 DIAGNOSIS — M545 Low back pain, unspecified: Secondary | ICD-10-CM | POA: Diagnosis not present

## 2020-11-07 DIAGNOSIS — M545 Low back pain, unspecified: Secondary | ICD-10-CM | POA: Diagnosis not present

## 2020-11-14 DIAGNOSIS — M545 Low back pain, unspecified: Secondary | ICD-10-CM | POA: Diagnosis not present

## 2020-11-21 DIAGNOSIS — M545 Low back pain, unspecified: Secondary | ICD-10-CM | POA: Diagnosis not present

## 2020-11-28 DIAGNOSIS — M545 Low back pain, unspecified: Secondary | ICD-10-CM | POA: Diagnosis not present

## 2020-12-06 NOTE — Progress Notes (Signed)
Chief Complaint  Patient presents with   Anxiety    Has been having a lot of stress lately. Feels her heart beating very fast at times.    Patient has been under a lot of family stress for the past week. She has noticed her heart beating very fast and hard.  There is no associated dizziness, shortness of breath or chest pain.  Tuesday night she noticed her heart racing and beating hard when she went to bed.  It seemed to stop and start.  She first noticed this Sunday, while at her brother's house for Thanksgiving.  There had been some fighting (involving brother's wife), and she noticed it when she felt stressed.  She felt like the family wanted her gone, which caused her to feel stressed and upset.  Related to her sister-in-law, not her brother. Then her car broke down on the way home Monday (towed, expensive), ended up back at their house, where she felt unwanted. Symptoms got worse. She stayed there the night, and ended up driving home in the dark on Tuesday. This made her anxious, she doesn't usually drive at night.  Has felt about the same since home. Some stress at home--needy neighbor (who lost her husband).  Over the holidays she had tea, soda or coffee, rather than her usual decaffeinated coffee.  Closing her eyes, taking deep breaths, holding her breath and letting it out slowly helps get the heart controlled.  Heart rate seems to return slowly, not suddenly. She seems to be very aware of any skipped/extra beats.  She is close with her brother, hiding how his wife made her feel, worries about his health. Most of this recent stress was related to her sister-in-law, and will not be ongoing--not going to have interactions with them over Christmas/New Years.   Denies missed doses of metoprolol.  This was started for high blood pressure but also had evaluation for palpitations in the past.  (She recalls seeing cardiologist, had stress test.) At one point was switched to ER, but too  expensive, has been back on the lopressor for many years.  Hypertension follow-up:  Patient reports compliance with metoprolol 50mg  BID and HCTZ 25mg  daily. Blood pressures are not monitored elsewhere.   Denies dizziness, headaches, chest pain.  Denies side effects of medications, no edema, no muscle cramps.  Palpitations and some tachycardia as noted above.   PMH, SH, SH reviewed  Outpatient Encounter Medications as of 12/07/2020  Medication Sig Note   Ascorbic Acid (VITAMIN C PO) Take by mouth.    Calcium Carbonate (CALCIUM 500 PO) Take 1 tablet by mouth daily.    celecoxib (CELEBREX) 200 MG capsule Take 1 capsule (200 mg total) by mouth daily. 08/14/2020: Takes prn, about 2-3x/week   glucosamine-chondroitin 500-400 MG tablet Take 1 tablet by mouth 3 (three) times daily.    hydrochlorothiazide (HYDRODIURIL) 25 MG tablet Take 1 tablet by mouth once daily    metoprolol tartrate (LOPRESSOR) 50 MG tablet Take 1 tablet (50 mg total) by mouth 2 (two) times daily.    Multiple Vitamin (MULTIVITAMIN) tablet Take 1 tablet by mouth daily.    valACYclovir (VALTREX) 500 MG tablet Take 1 tablet by mouth once daily    zinc gluconate 50 MG tablet Take 50 mg by mouth daily.    [DISCONTINUED] clobetasol ointment (TEMOVATE) 0.05 % Apply topically 2 (two) times daily.    No facility-administered encounter medications on file as of 12/07/2020.   Allergies  Allergen Reactions   Codeine Nausea  And Vomiting   No f/c, URI symptoms.  Denies chest pain, shortness of breath.  No headaches or dizziness. +heartburn related to certain foods, r/b OTC meds. 8-9# weight gain.  She thinks snacking after dinner (on ice cream) and recent Thanksgiving contributes. Denies changes in hair/skin/nails. Denies constipation or diarrhea, no blood/mucus in stools.   BP 122/84   Pulse 84   Ht 5' 2.5" (1.588 m)   Wt 187 lb (84.8 kg)   BMI 33.66 kg/m    Wt Readings from Last 3 Encounters:  12/07/20 187 lb (84.8 kg)  09/05/20  173 lb (78.5 kg)  08/14/20 178 lb 12.8 oz (81.1 kg)   Well-appearing female, mildly anxious, in no distress HEENT: conjunctiva and sclera are clear, EOMI, wearing mask Neck: no lymphadenopathy or mass. Borderline thyroid size Heart: regular rate and rhythm, with 2 skipped beats, but very regular rhythm Lungs: clear bilaterally Back: no spinal or CVA tenderness Extremities: no edema Psych: mildly anxious, full range of affect. Normal speech, eye contact, grooming Neuro: alert and oriented. Grossly normal strength, gait.  EKG: NSR, no PAC's or PVC's. Normal.   ASSESSMENT/PLAN:  Palpitations - suspect intermittent PAC/PVC, poss sinus tach.  Showed how to check pulse. To keep log. 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 (Normal last year, no other thyroid symptoms, other than explainable weight gain). H/o overactive thyroid in mother Last TSH normal in 12/2019  Counseled on stress reduction, consider counseling. Discussed s/sx sinus tach, afib, SVT, what to look for, how to keep journal. Continue current meds. Refer to cardiology if persists/worsens. Pt reassured.

## 2020-12-07 ENCOUNTER — Other Ambulatory Visit: Payer: Self-pay

## 2020-12-07 ENCOUNTER — Encounter: Payer: Self-pay | Admitting: Family Medicine

## 2020-12-07 ENCOUNTER — Ambulatory Visit (INDEPENDENT_AMBULATORY_CARE_PROVIDER_SITE_OTHER): Payer: Medicare Other | Admitting: Family Medicine

## 2020-12-07 VITALS — BP 122/84 | HR 84 | Ht 62.5 in | Wt 187.0 lb

## 2020-12-07 DIAGNOSIS — Z23 Encounter for immunization: Secondary | ICD-10-CM | POA: Diagnosis not present

## 2020-12-07 DIAGNOSIS — Z638 Other specified problems related to primary support group: Secondary | ICD-10-CM

## 2020-12-07 DIAGNOSIS — R002 Palpitations: Secondary | ICD-10-CM | POA: Diagnosis not present

## 2020-12-07 NOTE — Patient Instructions (Signed)
Be sure to stay well hydrated. Avoid excessive caffeine or alcohol.  Your recent increase in caffeine may have contributed to more palpitations (extra, or hard beats). Pay attention to your symptoms and keep a journal/log. When it is beating fast--please try and check your pulse (counting your heartbeat for 15 seconds and multiplying times 4, or counting for 30 seconds and multiplying times 2). If it is under 100, up to 120, that is usually going to be a sinus rhythm.  If it is 150's or higher that could be indicative of a different rhythm. If you ever have any associated dizziness, chest pain or shortness of breath, that would be very concerning, and definitely let us know. Regardless, if this is happening frequently, the next step would be a heart monitor, and we would refer you back to cardiology to get this done.  Also feel if the rhythm seems regular, or if it is skipping and jumping. Pay attention to what you were doing (exertion, stress), and keep these jotted down as well.

## 2020-12-11 ENCOUNTER — Other Ambulatory Visit: Payer: Medicare Other

## 2020-12-12 ENCOUNTER — Other Ambulatory Visit: Payer: Self-pay

## 2020-12-12 DIAGNOSIS — I10 Essential (primary) hypertension: Secondary | ICD-10-CM

## 2020-12-12 DIAGNOSIS — B001 Herpesviral vesicular dermatitis: Secondary | ICD-10-CM

## 2020-12-12 MED ORDER — HYDROCHLOROTHIAZIDE 25 MG PO TABS: 25.0000 mg | ORAL_TABLET | Freq: Every day | ORAL | 0 refills | Status: DC

## 2020-12-12 MED ORDER — VALACYCLOVIR HCL 500 MG PO TABS: 500.0000 mg | ORAL_TABLET | Freq: Every day | ORAL | 0 refills | Status: DC

## 2020-12-29 ENCOUNTER — Other Ambulatory Visit: Payer: Self-pay | Admitting: Family Medicine

## 2020-12-29 DIAGNOSIS — I10 Essential (primary) hypertension: Secondary | ICD-10-CM

## 2021-01-09 ENCOUNTER — Telehealth: Payer: Self-pay | Admitting: Physician Assistant

## 2021-01-09 NOTE — Telephone Encounter (Signed)
Left message for patient to call back and schedule Medicare Annual Wellness Visit (AWV) either virtually or in office. I left my number for patient to call [phone].  Last AWV 12/28/19 ; please schedule at anytime with health coach  This should be a 45 minute visit.

## 2021-02-23 ENCOUNTER — Other Ambulatory Visit: Payer: Self-pay | Admitting: Physician Assistant

## 2021-02-23 DIAGNOSIS — Z1231 Encounter for screening mammogram for malignant neoplasm of breast: Secondary | ICD-10-CM

## 2021-03-15 ENCOUNTER — Other Ambulatory Visit: Payer: Self-pay | Admitting: Family Medicine

## 2021-03-15 ENCOUNTER — Telehealth: Payer: Self-pay

## 2021-03-15 DIAGNOSIS — B001 Herpesviral vesicular dermatitis: Secondary | ICD-10-CM

## 2021-03-15 DIAGNOSIS — I10 Essential (primary) hypertension: Secondary | ICD-10-CM

## 2021-03-15 MED ORDER — METOPROLOL TARTRATE 50 MG PO TABS: 50.0000 mg | ORAL_TABLET | Freq: Two times a day (BID) | ORAL | 0 refills | Status: DC

## 2021-03-15 NOTE — Telephone Encounter (Signed)
Pt has appt 03/19/21 and will be out of med on Saturday. HCTZ  and metoprolol were sent in but unable to fill the valtrex without approval. Pharmacy is [facility]. Please advise KH ?

## 2021-03-15 NOTE — Telephone Encounter (Signed)
done

## 2021-03-15 NOTE — Telephone Encounter (Signed)
Pt. Called stating she needs all of her meds sent in today if possible to [facility] hctz, metoprolol, valtrex, and celebrex for a 3 month supply instead of 30 day. She has an apt. Monday but will run out before then.  ?

## 2021-03-16 NOTE — Progress Notes (Signed)
Cassandra Hendricks is a 71 y.o. female who presents for annual wellness visit and follow-up on chronic medical conditions.   ? ?She has the following concerns: ? 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 ?Immunizations and Health Maintenance ?Immunization History  ?Administered Date(s) Administered  ? Fluad Quad(high Dose 65+) 09/23/2018, 09/15/2019, 10/16/2020  ? Influenza Split 10/24/2015  ? Influenza, High Dose Seasonal PF 11/02/2014, 10/14/2017  ? Influenza,inj,Quad PF,6+ Mos 10/04/2016  ? Influenza-Unspecified 10/08/2014  ? PFIZER(Purple Top)SARS-COV-2 Vaccination 03/07/2019, 03/31/2019, 11/19/2019  ? Pension scheme manager 86yrs & up 12/07/2020  ? Pneumococcal Conjugate-13 07/01/2017  ? Pneumococcal-Unspecified 12/08/2014  ? Tdap 05/11/2016  ? Zoster Recombinat (Shingrix) 07/02/2017, 10/14/2017  ? Zoster, Live 05/08/2015  ? ?Health Maintenance Due  ?Topic Date Due  ? COLONOSCOPY (Pts 45-34yrs Insurance coverage will need to be confirmed)  Never done  ? Pneumonia Vaccine 65+ Years old (2 - PPSV23 if available, else PCV20) 07/02/2018  ? ? ? ?Advanced directives: paperwork given to paper for DNR and advanced directives, she declined to discuss it at today's office visit and was encouraged to discuss with her Daughter and Chief Executive Officer. ?  ? ?Depression screen:  See questionnaire below.  ?Depression screen [facility] 2/9 03/19/2021 12/07/2020 12/28/2019 09/15/2019 12/15/2018  ?Decreased Interest 0 0 0 0 0  ?Down, Depressed, Hopeless 0 0 0 0 0  ?PHQ - 2 Score 0 0 0 0 0  ? ? ?Fall Risk Screen: see questionnaire below. ?Fall Risk  03/19/2021 12/07/2020 08/14/2020 12/28/2019 09/15/2019  ?Falls in the past year? 0 0 0 0 0  ?Number falls in past yr: 0 0 0 0 0  ?Injury with Fall? 0 0 0 0 0  ?Risk for fall due to : No Fall Risks - No Fall Risks - -  ?Follow up  Falls evaluation completed Falls evaluation completed Falls evaluation completed - -  ? ? ?ADL screen:  See questionnaire below ?Functional Status Survey: ?  ? ? ?Review of Systems ?Constitutional: -unexpected weight change, -anorexia, -fatigue ?Allergy: -sneezing, -itching, -congestion ?Dermatology: denies changing moles, rash, lumps ?ENT: -runny nose, -ear pain, -sore throat,  ?Cardiology:  -chest pain, -palpitations, -orthopnea, ?Respiratory: -cough, -shortness of breath, -dyspnea on exertion, -wheezing,  ?Gastroenterology: -abdominal pain, -nausea, -vomiting, -diarrhea, -constipation, -dysphagia ?Hematology: -bleeding or bruising problems ?Musculoskeletal: -arthralgias, -myalgias, -joint swelling, -back pain ?Ophthalmology: -vision changes,  ?Urology: -dysuria, -difficulty urinating,  -urinary frequency, -urgency, - incontinence ?Neurology: -numbness, , -memory loss, -falls, -dizziness ? ? ? ?PHYSICAL EXAM: ? ?BP 130/80   Pulse 65   Wt 185 lb 6.4 oz (84.1 kg)   SpO2 97%   BMI 33.37 kg/m?  ? ?General Appearance: Alert, cooperative, no distress, appears stated age ?Head: Normocephalic, without obvious abnormality, atraumatic ?Eyes: PERRL, conjunctiva/corneas clear, EOM's intact, fundi benign ?Ears: Normal TM's and external ear canals ?Nose: Nares normal, mucosa normal, no drainage or sinus tenderness ?Throat: Lips, mucosa, and tongue normal; teeth and gums normal ?Neck: Supple, no lymphadenopathy;  thyroid:  no enlargement/tenderness/nodules; no carotid bruit or JVD ?Lungs: Clear to auscultation bilaterally without wheezes, rales or ronchi; respirations unlabored ?Heart: Regular rate and rhythm, S1 and S2 normal, no murmur, rubor gallop ?Abdomen: Soft, non-tender, nondistended, normoactive bowel sounds,  ?no masses, no hepatosplenomegaly ?Extremities: No clubbing, cyanosis or edema ?Pulses: 2+ and symmetric all extremities ?Skin:  Skin color,  texture, turgor normal, no rashes or lesions ?Lymph nodes: Cervical,  supraclavicular, and axillary nodes normal ?Neurologic:  CNII-XII intact, normal strength, sensation and gait; reflexes 2+ and symmetric throughout ?Psych: Normal mood, affect, hygiene and grooming. ? ?ASSESSMENT/PLAN: ?Assessment: ?Encounter Diagnoses  ?Name Primary?  ? Medicare annual wellness visit, subsequent Yes  ? Primary hypertension   ? Mixed hyperlipidemia   ? ? ? ?Plan: ? ? ?Cassandra Hendricks was seen today for annual exam. ? ?Diagnoses and all orders for this visit: ? ?Medicare annual wellness visit, subsequent ? ?Primary hypertension ?-     hydrochlorothiazide (HYDRODIURIL) 25 MG tablet; Take 1 tablet (25 mg total) by mouth daily. ?-     metoprolol tartrate (LOPRESSOR) 50 MG tablet; Take 1 tablet (50 mg total) by mouth 2 (two) times daily. ? ?Mixed hyperlipidemia ? ? ? ? ?Discussed monthly self breast exams and yearly mammograms; at least 30 minutes of aerobic activity at least 5 days/week and weight-bearing exercise 2x/week; proper sunscreen use reviewed; healthy diet, including goals of calcium and vitamin D intake and alcohol recommendations (less than or equal to 1 drink/day) reviewed; regular seatbelt use; changing batteries in smoke detectors.  Immunization recommendations discussed.  Colonoscopy recommendations reviewed ? ?Return in 6 months and will have labs done then. ? ?Medicare Attestation ?I have personally reviewed: ?The patient's medical and social history ?Their use of alcohol, tobacco or illicit drugs ?Their current medications and supplements ?The patient's functional ability including ADLs,fall risks, home safety risks, cognitive, and hearing and visual impairment ?Diet and physical activities ?Evidence for depression or mood disorders ? ?The patient's weight, height, and BMI have been recorded in the chart.  I have made referrals, counseling, and provided education to the patient based on review of the above and I have provided the patient with a written personalized care plan for preventive  services.   ? ? ?Cassandra Pap, PA-C   03/19/2021   ?

## 2021-03-19 ENCOUNTER — Ambulatory Visit (INDEPENDENT_AMBULATORY_CARE_PROVIDER_SITE_OTHER): Payer: Medicare Other | Admitting: Physician Assistant

## 2021-03-19 ENCOUNTER — Encounter: Payer: Self-pay | Admitting: Physician Assistant

## 2021-03-19 ENCOUNTER — Other Ambulatory Visit: Payer: Self-pay

## 2021-03-19 VITALS — BP 130/80 | HR 65 | Ht 62.5 in | Wt 185.4 lb

## 2021-03-19 DIAGNOSIS — Z0000 Encounter for general adult medical examination without abnormal findings: Secondary | ICD-10-CM

## 2021-03-19 DIAGNOSIS — E782 Mixed hyperlipidemia: Secondary | ICD-10-CM

## 2021-03-19 DIAGNOSIS — Z8249 Family history of ischemic heart disease and other diseases of the circulatory system: Secondary | ICD-10-CM | POA: Insufficient documentation

## 2021-03-19 DIAGNOSIS — Z1211 Encounter for screening for malignant neoplasm of colon: Secondary | ICD-10-CM | POA: Diagnosis not present

## 2021-03-19 DIAGNOSIS — R7303 Prediabetes: Secondary | ICD-10-CM | POA: Diagnosis not present

## 2021-03-19 DIAGNOSIS — I10 Essential (primary) hypertension: Secondary | ICD-10-CM

## 2021-03-19 DIAGNOSIS — Z833 Family history of diabetes mellitus: Secondary | ICD-10-CM | POA: Insufficient documentation

## 2021-03-19 MED ORDER — METOPROLOL TARTRATE 50 MG PO TABS: 50.0000 mg | ORAL_TABLET | Freq: Two times a day (BID) | ORAL | 1 refills | Status: DC

## 2021-03-19 MED ORDER — HYDROCHLOROTHIAZIDE 25 MG PO TABS: 25.0000 mg | ORAL_TABLET | Freq: Every day | ORAL | 1 refills | Status: DC

## 2021-03-19 NOTE — Patient Instructions (Signed)
Preventative Care for Adults - Female °   °  MAINTAIN REGULAR HEALTH EXAMS: °A routine yearly physical is a good way to check in with your primary care provider about your health and preventive screening. It is also an opportunity to share updates about your health and any concerns you have, and receive a thorough all-over exam.  °Most health insurance companies pay for at least some preventative services.  Check with your health plan for specific coverages. ° °WHAT PREVENTATIVE SERVICES DO WOMEN NEED? °Adult women should have their weight and blood pressure checked regularly.  °Women age 35 and older should have their cholesterol levels checked regularly. °Women should be screened for cervical cancer with a Pap smear and pelvic exam beginning at either age 21, or 3 years after they become sexually activity.   °Breast cancer screening generally begins at age 40 with a mammogram and breast exam by your primary care provider.   °Beginning at age 45 and continuing to age 75, women should be screened for colorectal cancer.  Certain people may need continued testing until age 85. °Updating vaccinations is part of preventative care.  Vaccinations help protect against diseases such as the flu. °Osteoporosis is a disease in which the bones lose minerals and strength as we age. Women ages 65 and over should discuss this with their caregivers, as should women after menopause who have other risk factors. °Lab tests are generally done as part of preventative care to screen for anemia and blood disorders, to screen for problems with the kidneys and liver, to screen for bladder problems, to check blood sugar, and to check your cholesterol level. °Preventative services generally include counseling about diet, exercise, avoiding tobacco, drugs, excessive alcohol consumption, and sexually transmitted infections.   ° °GENERAL RECOMMENDATIONS FOR GOOD HEALTH: ° °Healthy diet: °Eat a variety of foods, including fruit, vegetables,  animal or vegetable protein, such as meat, fish, chicken, and eggs, or beans, lentils, tofu, and grains, such as rice. °Drink plenty of water daily (60 - 80 ounces or 8 - 10 glasses a day) °Decrease saturated fat in the diet, avoid lots of red meat, processed foods, sweets, fast foods, and fried foods. For high cholesterol - Increase fiber intake (Benefiber or Metamucil, Cherrios,  oatmeal, beans, nuts, fruits and vegetables), limit saturated fats (in fried foods, red meat), can add OTC fish oil supplement, eat fish with Omega-3 fatty acids like salmon and tuna, exercise for 30 minutes 3 - 5 times a week, drink 8 - 10 glasses of water a day ° °Exercise: °Aerobic exercise helps maintain good heart health. At least 30-40 minutes of moderate-intensity exercise is recommended. For example, a brisk walk that increases your heart rate and breathing. This should be done on most days of the week.  °Find a type of exercise or a variety of exercises that you enjoy so that it becomes a part of your daily life.  Examples are running, walking, swimming, water aerobics, and biking.  For motivation and support, explore group exercise such as aerobic class, spin class, Zumba, Yoga,or  martial arts, etc.   °Set exercise goals for yourself, such as a certain weight goal, walk or run in a race such as a 5k walk/run.  Speak to your primary care provider about exercise goals. ° °Disease prevention: °If you smoke or chew tobacco, find out from your caregiver how to quit. It can literally save your life, no matter how long you have been a tobacco user. If you do not   use tobacco, never begin.  °Maintain a healthy diet and normal weight. Increased weight leads to problems with blood pressure and diabetes.  °The Body Mass Index or BMI is a way of measuring how much of your body is fat. Having a BMI above 27 increases the risk of heart disease, diabetes, hypertension, stroke and other problems related to obesity. Your caregiver can help  determine your BMI and based on it develop an exercise and dietary program to help you achieve or maintain this important measurement at a healthful level. °High blood pressure causes heart and blood vessel problems.  Persistent high blood pressure should be treated with medicine if weight loss and exercise do not work.  °Fat and cholesterol leaves deposits in your arteries that can block them. This causes heart disease and vessel disease elsewhere in your body.  If your cholesterol is found to be high, or if you have heart disease or certain other medical conditions, then you may need to have your cholesterol monitored frequently and be treated with medication.  °Ask if you should have a cardiac stress test if your history suggests this. A stress test is a test done on a treadmill that looks for heart disease. This test can find disease prior to there being a problem. °Menopause can be associated with physical symptoms and risks. Hormone replacement therapy is available to decrease these. You should talk to your caregiver about whether starting or continuing to take hormones is right for you.  °Osteoporosis is a disease in which the bones lose minerals and strength as we age. This can result in serious bone fractures. Risk of osteoporosis can be identified using a bone density scan. Women ages 65 and over should discuss this with their caregivers, as should women after menopause who have other risk factors. Ask your caregiver whether you should be taking a calcium supplement and Vitamin D, to reduce the rate of osteoporosis.  °Avoid drinking alcohol in excess (more than two drinks per day).  Avoid use of street drugs. Do not share needles with anyone. Ask for professional help if you need assistance or instructions on stopping the use of alcohol, cigarettes, and/or drugs. °Brush your teeth twice a day with fluoride toothpaste, and floss once a day. Good oral hygiene prevents tooth decay and gum disease. The  problems can be painful, unattractive, and can cause other health problems. Visit your dentist for a routine oral and dental check up and preventive care every 6-12 months.  °Look at your skin regularly.  Use a mirror to look at your back. Notify your caregivers of changes in moles, especially if there are changes in shapes, colors, a size larger than a pencil eraser, an irregular border, or development of new moles. ° °Safety: °Use seatbelts 100% of the time, whether driving or as a passenger.  Use safety devices such as hearing protection if you work in environments with loud noise or significant background noise.  Use safety glasses when doing any work that could send debris in to the eyes.  Use a helmet if you ride a bike or motorcycle.  Use appropriate safety gear for contact sports.  Talk to your caregiver about gun safety. °Use sunscreen with a SPF (or skin protection factor) of 15 or greater.  Lighter skinned people are at a greater risk of skin cancer. Don’t forget to also wear sunglasses in order to protect your eyes from too much damaging sunlight. Damaging sunlight can accelerate cataract formation.  °Practice safe sex. Use   condoms. Condoms are used for birth control and to help reduce the spread of sexually transmitted infections (or STIs).  Some of the STIs are gonorrhea (the clap), chlamydia, syphilis, trichomonas, herpes, HPV (human papilloma virus) and HIV (human immunodeficiency virus) which causes AIDS. The herpes, HIV and HPV are viral illnesses that have no cure. These can result in disability, cancer and death.  °Keep carbon monoxide and smoke detectors in your home functioning at all times. Change the batteries every 6 months or use a model that plugs into the wall.  ° °Vaccinations: °Stay up to date with your tetanus shots and other required immunizations. You should have a booster for tetanus every 10 years. Be sure to get your flu shot every year, since 5%-20% of the [place] population comes  down with the flu. The flu vaccine changes each year, so being vaccinated once is not enough. Get your shot in the fall, before the flu season peaks.   °Other vaccines to consider: °Human Papilloma Virus or HPV causes cancer of the cervix, and other infections that can be transmitted from person to person. There is a vaccine for HPV, and females should get immunized between the ages of 11 and 26. It requires a series of 3 shots.  °Pneumococcal vaccine to protect against certain types of pneumonia.  This is normally recommended for adults age 65 or older.  However, adults younger than 72 years old with certain underlying conditions such as diabetes, heart or lung disease should also receive the vaccine. °Shingles vaccine to protect against Varicella Zoster if you are older than age 60, or younger than 72 years old with certain underlying illness. °Hepatitis A vaccine to protect against a form of infection of the liver by a virus acquired from food. °Hepatitis B vaccine to protect against a form of infection of the liver by a virus acquired from blood or body fluids, particularly if you work in health care. °If you plan to travel internationally, check with your local health department for specific vaccination recommendations. ° °Cancer Screening: °Breast cancer screening is essential to preventive care for women. All women age 20 and older should perform a breast self-exam every month. At age 40 and older, women should have their caregiver complete a breast exam each year. Women at ages 40 and older should have a mammogram (x-ray film) of the breasts. Your caregiver can discuss how often you need mammograms.   °Cervical cancer screening includes taking a Pap smear (sample of cells examined under a microscope) from the cervix (end of the uterus). It also includes testing for HPV (Human Papilloma Virus, which can cause cervical cancer). Screening and a pelvic exam should begin at age 21, or 3 years after a woman  becomes sexually active. Screening should occur every year, with a Pap smear but no HPV testing, up to age 30. After age 30, you should have a Pap smear every 3 years with HPV testing, if no HPV was found previously.  °Most routine colon cancer screening begins at the age of 50. On a yearly basis, doctors may provide special easy to use take-home tests to check for hidden blood in the stool. Sigmoidoscopy or colonoscopy can detect the earliest forms of colon cancer and is life saving. These tests use a small camera at the end of a tube to directly examine the colon. Speak to your caregiver about this at age 50, when routine screening begins (and is repeated every 5 years unless early forms of pre-cancerous polyps   or small growths are found).  °  °

## 2021-03-21 ENCOUNTER — Telehealth: Payer: Self-pay | Admitting: Physician Assistant

## 2021-03-21 NOTE — Telephone Encounter (Signed)
Cologuard is telling her that it is too soon to do test, needs to wait until September ? ?Please advise ?

## 2021-03-29 NOTE — Telephone Encounter (Signed)
It's okay to wait until 09/2021

## 2021-04-09 ENCOUNTER — Ambulatory Visit
Admission: RE | Admit: 2021-04-09 | Discharge: 2021-04-09 | Disposition: A | Discharge status: Home / Self Care | Payer: Medicare Other | Source: Ambulatory Visit | Attending: Physician Assistant | Admitting: Physician Assistant

## 2021-04-09 DIAGNOSIS — Z1231 Encounter for screening mammogram for malignant neoplasm of breast: Secondary | ICD-10-CM

## 2021-04-17 NOTE — Addendum Note (Signed)
Addended by: Burnard Hawthorne on: 04/17/2021 12:20 PM ? ? Modules accepted: Level of Service ? ?

## 2021-06-24 ENCOUNTER — Other Ambulatory Visit: Payer: Self-pay | Admitting: Physician Assistant

## 2021-06-24 DIAGNOSIS — B001 Herpesviral vesicular dermatitis: Secondary | ICD-10-CM

## 2021-09-24 ENCOUNTER — Other Ambulatory Visit: Payer: Self-pay | Admitting: Physician Assistant

## 2021-09-24 DIAGNOSIS — B001 Herpesviral vesicular dermatitis: Secondary | ICD-10-CM

## 2021-10-01 ENCOUNTER — Ambulatory Visit (INDEPENDENT_AMBULATORY_CARE_PROVIDER_SITE_OTHER): Payer: Medicare Other | Admitting: Medical

## 2021-10-01 VITALS — BP 120/80 | HR 60 | Wt 183.2 lb

## 2021-10-01 DIAGNOSIS — Z833 Family history of diabetes mellitus: Secondary | ICD-10-CM

## 2021-10-01 DIAGNOSIS — D696 Thrombocytopenia, unspecified: Secondary | ICD-10-CM | POA: Diagnosis not present

## 2021-10-01 DIAGNOSIS — Z8249 Family history of ischemic heart disease and other diseases of the circulatory system: Secondary | ICD-10-CM

## 2021-10-01 DIAGNOSIS — I10 Essential (primary) hypertension: Secondary | ICD-10-CM | POA: Diagnosis not present

## 2021-10-01 DIAGNOSIS — Z1231 Encounter for screening mammogram for malignant neoplasm of breast: Secondary | ICD-10-CM

## 2021-10-01 DIAGNOSIS — Z862 Personal history of diseases of the blood and blood-forming organs and certain disorders involving the immune mechanism: Secondary | ICD-10-CM | POA: Diagnosis not present

## 2021-10-01 DIAGNOSIS — B001 Herpesviral vesicular dermatitis: Secondary | ICD-10-CM | POA: Insufficient documentation

## 2021-10-01 DIAGNOSIS — Z780 Asymptomatic menopausal state: Secondary | ICD-10-CM | POA: Insufficient documentation

## 2021-10-01 DIAGNOSIS — Z23 Encounter for immunization: Secondary | ICD-10-CM

## 2021-10-01 DIAGNOSIS — R7303 Prediabetes: Secondary | ICD-10-CM

## 2021-10-01 DIAGNOSIS — M8580 Other specified disorders of bone density and structure, unspecified site: Secondary | ICD-10-CM

## 2021-10-01 DIAGNOSIS — E782 Mixed hyperlipidemia: Secondary | ICD-10-CM

## 2021-10-01 DIAGNOSIS — Z1211 Encounter for screening for malignant neoplasm of colon: Secondary | ICD-10-CM

## 2021-10-01 NOTE — Progress Notes (Unsigned)
Subjective:  Cassandra Hendricks is a 71 y.o. female who presents for Chief Complaint  Patient presents with   med check    Med check would like a flu shot     Medical team: Dr. Katrine Coho, PT Dr. Wandra Feinstein, ortho Podiatry, [facility] Dr. Heath Lark, hematology Deshayla Empson, Camelia Eng, PA-C here today, but was seeing Harland Dingwall, NP and Francis Gaines, PA here prior  Concerns: Here for med check  Hypertension-compliant with hydrochlorothiazide 25 mg daily, metoprolol 50 mg twice daily. Denies chest pain, palpitations or edema.  History of hyperlipidemia per chart record, but she is not on any statin or cholesterol medication  History of thrombocytopenia.  She takes iron every other day.  She has seen hematology in the past  Sees orthopedics, uses Celebrex periodically.  She is on other supplements  History of cold sores-uses Valtrex daily for prevention  Nonsmoker  Drinks rarely, last drink new years eve.  No prior heavy alcohl use.  No other aggravating or relieving factors.    No other c/o.  Past Medical History:  Diagnosis Date   Chronic neck pain    per med record from [place]   Female bladder prolapse    Herpes labialis    History of prediabetes 11/21/2015   Hemoglobin A1c 5.9% 05/10/2015. Prior to that 6.1% on 12/21/2014   History of thrombocytopenia    HTN (hypertension)    controlled with HCTZ and metoprolol, DASH. EKG 05/2015 NSR, no abnormalities   Mixed hyperlipidemia    per records from 05/2015 in [place]. diet and exercise   Osteopenia determined by x-ray    DEXA 05/30/2014 in [place] [facility]    Current Outpatient Medications on File Prior to Visit  Medication Sig Dispense Refill   Ascorbic Acid (VITAMIN C PO) Take by mouth.     Calcium Carbonate (CALCIUM 500 PO) Take 1 tablet by mouth daily.     celecoxib (CELEBREX) 100 MG capsule Take 100 mg by mouth daily as needed for moderate pain.     glucosamine-chondroitin  500-400 MG tablet Take 1 tablet by mouth 3 (three) times daily.     hydrochlorothiazide (HYDRODIURIL) 25 MG tablet Take 1 tablet (25 mg total) by mouth daily. 90 tablet 1   metoprolol tartrate (LOPRESSOR) 50 MG tablet Take 1 tablet (50 mg total) by mouth 2 (two) times daily. 180 tablet 1   Multiple Vitamin (MULTIVITAMIN) tablet Take 1 tablet by mouth daily.     valACYclovir (VALTREX) 500 MG tablet Take 1 tablet by mouth once daily 90 tablet 0   zinc gluconate 50 MG tablet Take 50 mg by mouth daily.     No current facility-administered medications on file prior to visit.     The following portions of the patient's history were reviewed and updated as appropriate: allergies, current medications, past family history, past medical history, past social history, past surgical history and problem list.  ROS Otherwise as in subjective above  Objective: BP 120/80   Pulse 60   Wt 183 lb 3.2 oz (83.1 kg)   BMI 32.97 kg/m   General appearance: alert, no distress, well developed, well nourished Neck: supple, no lymphadenopathy, no thyromegaly, no masses Heart: RRR, normal S1, S2, no murmurs Lungs: CTA bilaterally, no wheezes, rhonchi, or rales Pulses: 2+ radial pulses, 2+ pedal pulses, normal cap refill Ext: no edema   Assessment: Encounter Diagnoses  Name Primary?   Primary hypertension Yes   Needs flu shot  History of thrombocytopenia    Thrombocytopenia (HCC)    Family history of diabetes mellitus    Family history of MI (myocardial infarction)    Mixed hyperlipidemia    Osteopenia determined by x-ray    Prediabetes    Screen for colon cancer    Post-menopausal    Encounter for screening mammogram for malignant neoplasm of breast    Recurrent cold sores      Plan: Hypertension is compliant with medication, routine labs this week fasting.  She will return for labs fasting  History of hyperlipidemia-updated labs fasting this week.  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  Impaired glucose - updated labs this week   Vaccine counseling: Counseled on the influenza virus vaccine.  Vaccine information sheet given.   High dose Influenza vaccine given after consent obtained.  Consider Prevnar 20 within the next 12 months  Get the updated covid when available.   Screening for breast cancer that she plans get mammogram in January  Postmenopausal, osteopenia-advise she go ahead and call and schedule bone density test.  Screening for colon cancer She will update the cologuard test soon for colon cancer screening   [place] was seen today for med check.  Diagnoses and all orders for this visit:  Primary hypertension -     Comprehensive metabolic panel; Future  Needs flu shot -     Flu Vaccine QUAD High Dose(Fluad)  History of thrombocytopenia  Thrombocytopenia (HCC) -     CBC with Differential/Platelet; Future -     Iron; Future  Family history of diabetes mellitus  Family history of MI (myocardial infarction)  Mixed hyperlipidemia -     Comprehensive metabolic panel; Future -     Lipid panel; Future  Osteopenia determined by x-ray -     DG Bone Density; Future  Prediabetes -     Hemoglobin A1c; Future  Screen for colon cancer  Post-menopausal -     DG Bone Density; Future  Encounter for screening mammogram for malignant neoplasm of breast -     MM Digital Screening; Future  Recurrent cold sores   Spent > 45 minutes face to face with patient in discussion of symptoms, evaluation, plan and recommendations.     Follow up: pending labs

## 2021-10-01 NOTE — Patient Instructions (Signed)
Please call to schedule your mammogram and bone density test.   [facility]  [phone] [address], [place], [place] [address]

## 2021-10-02 ENCOUNTER — Other Ambulatory Visit: Payer: Medicare Other

## 2021-10-02 ENCOUNTER — Ambulatory Visit: Payer: Medicare Other | Admitting: Podiatry

## 2021-10-02 DIAGNOSIS — D696 Thrombocytopenia, unspecified: Secondary | ICD-10-CM

## 2021-10-02 DIAGNOSIS — M778 Other enthesopathies, not elsewhere classified: Secondary | ICD-10-CM

## 2021-10-02 DIAGNOSIS — E782 Mixed hyperlipidemia: Secondary | ICD-10-CM

## 2021-10-02 DIAGNOSIS — R7303 Prediabetes: Secondary | ICD-10-CM

## 2021-10-02 DIAGNOSIS — I10 Essential (primary) hypertension: Secondary | ICD-10-CM

## 2021-10-03 LAB — LIPID PANEL
Chol/HDL Ratio: 4 ratio (ref 0.0–4.4)
Cholesterol, Total: 227 mg/dL — ABNORMAL HIGH (ref 100–199)
HDL: 57 mg/dL (ref >39)
LDL Chol Calc (NIH): 149 mg/dL — ABNORMAL HIGH (ref 0–99)
Triglycerides: 119 mg/dL (ref 0–149)
VLDL Cholesterol Cal: 21 mg/dL (ref 5–40)

## 2021-10-03 LAB — COMPREHENSIVE METABOLIC PANEL
ALT: 25 IU/L (ref 0–32)
AST: 25 IU/L (ref 0–40)
Albumin/Globulin Ratio: 2.2 (ref 1.2–2.2)
Albumin: 4.7 g/dL (ref 3.8–4.8)
Alkaline Phosphatase: 77 IU/L (ref 44–121)
BUN/Creatinine Ratio: 29 — ABNORMAL HIGH (ref 12–28)
BUN: 19 mg/dL (ref 8–27)
Bilirubin Total: 0.6 mg/dL (ref 0.0–1.2)
CO2: 27 mmol/L (ref 20–29)
Calcium: 10 mg/dL (ref 8.7–10.3)
Chloride: 97 mmol/L (ref 96–106)
Creatinine, Ser: 0.66 mg/dL (ref 0.57–1.00)
Globulin, Total: 2.1 g/dL (ref 1.5–4.5)
Glucose: 144 mg/dL — ABNORMAL HIGH (ref 70–99)
Potassium: 4.1 mmol/L (ref 3.5–5.2)
Sodium: 138 mmol/L (ref 134–144)
Total Protein: 6.8 g/dL (ref 6.0–8.5)
eGFR: 93 mL/min/{1.73_m2} (ref >59)

## 2021-10-03 LAB — CBC WITH DIFFERENTIAL/PLATELET
Basophils Absolute: 0.1 10*3/uL (ref 0.0–0.2)
Basos: 1 %
EOS (ABSOLUTE): 0.1 10*3/uL (ref 0.0–0.4)
Eos: 1 %
Hematocrit: 43.7 % (ref 34.0–46.6)
Hemoglobin: 14.9 g/dL (ref 11.1–15.9)
Immature Grans (Abs): 0 10*3/uL (ref 0.0–0.1)
Immature Granulocytes: 0 %
Lymphocytes Absolute: 1.3 10*3/uL (ref 0.7–3.1)
Lymphs: 15 %
MCH: 31 pg (ref 26.6–33.0)
MCHC: 34.1 g/dL (ref 31.5–35.7)
MCV: 91 fL (ref 79–97)
Monocytes Absolute: 0.5 10*3/uL (ref 0.1–0.9)
Monocytes: 5 %
Neutrophils Absolute: 6.6 10*3/uL (ref 1.4–7.0)
Neutrophils: 78 %
Platelets: 145 10*3/uL — ABNORMAL LOW (ref 150–450)
RBC: 4.8 x10E6/uL (ref 3.77–5.28)
RDW: 11.8 % (ref 11.7–15.4)
WBC: 8.5 10*3/uL (ref 3.4–10.8)

## 2021-10-03 LAB — HEMOGLOBIN A1C
Est. average glucose Bld gHb Est-mCnc: 140 mg/dL
Hgb A1c MFr Bld: 6.5 % — ABNORMAL HIGH (ref 4.8–5.6)

## 2021-10-03 LAB — IRON: Iron: 95 ug/dL (ref 27–139)

## 2021-10-05 ENCOUNTER — Telehealth: Payer: Self-pay | Admitting: Medical

## 2021-10-05 NOTE — Telephone Encounter (Signed)
Pt called and states that a mammogram was ordered for her at her visit 10/01/2021. Pt states she received a call from the breast center and was advised that her last mammogram was April of 2023. That mammogram is available in her chart. She was advised that the orders need to be for April of 2024. She will receive Bone Density at that time as well. Please change orders to 04/2022.

## 2021-10-05 NOTE — Telephone Encounter (Signed)
I have changed the date on orders for April of next year

## 2021-10-05 NOTE — Addendum Note (Signed)
Addended by: Minette Headland A on: 10/05/2021 03:42 PM   Modules accepted: Orders

## 2021-10-11 ENCOUNTER — Ambulatory Visit (INDEPENDENT_AMBULATORY_CARE_PROVIDER_SITE_OTHER): Payer: Medicare Other

## 2021-10-11 ENCOUNTER — Ambulatory Visit (INDEPENDENT_AMBULATORY_CARE_PROVIDER_SITE_OTHER): Payer: Medicare Other | Admitting: Podiatry

## 2021-10-11 DIAGNOSIS — M778 Other enthesopathies, not elsewhere classified: Secondary | ICD-10-CM | POA: Diagnosis not present

## 2021-10-11 DIAGNOSIS — M722 Plantar fascial fibromatosis: Secondary | ICD-10-CM

## 2021-10-11 DIAGNOSIS — M2041 Other hammer toe(s) (acquired), right foot: Secondary | ICD-10-CM | POA: Diagnosis not present

## 2021-10-11 DIAGNOSIS — M2011 Hallux valgus (acquired), right foot: Secondary | ICD-10-CM | POA: Diagnosis not present

## 2021-10-11 NOTE — Progress Notes (Signed)
Subjective:  Patient ID: Chilton Greathouse, female    DOB: May 13, 1950,  MRN: [id number] HPI Chief Complaint  Patient presents with   Foot Problem    hx of plantar fasciitis/ right foot possible nail fungus/ 2nd toe shifting/ bunion right foot    71 y.o. female presents with the above complaint.   ROS: Denies fever chills nausea vomiting muscle aches pains calf pain back pain chest pain shortness of breath.  Past Medical History:  Diagnosis Date   Chronic neck pain    per med record from [place]   Female bladder prolapse    Herpes labialis    History of prediabetes 11/21/2015   Hemoglobin A1c 5.9% 05/10/2015. Prior to that 6.1% on 12/21/2014   History of thrombocytopenia    HTN (hypertension)    controlled with HCTZ and metoprolol, DASH. EKG 05/2015 NSR, no abnormalities   Mixed hyperlipidemia    per records from 05/2015 in [place]. diet and exercise   Osteopenia determined by x-ray    DEXA 05/30/2014 in [place] [facility]    Past Surgical History:  Procedure Laterality Date   BLADDER REPAIR     2014 in [place]     2014    Current Outpatient Medications:    Ascorbic Acid (VITAMIN C PO), Take by mouth., Disp: , Rfl:    Calcium Carbonate (CALCIUM 500 PO), Take 1 tablet by mouth daily., Disp: , Rfl:    celecoxib (CELEBREX) 100 MG capsule, Take 100 mg by mouth daily as needed for moderate pain., Disp: , Rfl:    glucosamine-chondroitin 500-400 MG tablet, Take 1 tablet by mouth 3 (three) times daily., Disp: , Rfl:    hydrochlorothiazide (HYDRODIURIL) 25 MG tablet, Take 1 tablet (25 mg total) by mouth daily., Disp: 90 tablet, Rfl: 1   metoprolol tartrate (LOPRESSOR) 50 MG tablet, Take 1 tablet (50 mg total) by mouth 2 (two) times daily., Disp: 180 tablet, Rfl: 1   Multiple Vitamin (MULTIVITAMIN) tablet, Take 1 tablet by mouth daily., Disp: , Rfl:    valACYclovir (VALTREX) 500 MG tablet, Take 1 tablet by mouth once daily, Disp: 90 tablet,  Rfl: 0   zinc gluconate 50 MG tablet, Take 50 mg by mouth daily., Disp: , Rfl:   Allergies  Allergen Reactions   Codeine Nausea And Vomiting   Review of Systems Objective:  There were no vitals filed for this visit.  General: Well developed, nourished, in no acute distress, alert and oriented x3   Dermatological: Skin is warm, dry and supple bilateral. Nails x 10 are well maintained; remaining integument appears unremarkable at this time. There are no open sores, no preulcerative lesions, no rash or signs of infection present.  Vascular: Dorsalis Pedis artery and Posterior Tibial artery pedal pulses are 2/4 bilateral with immedate capillary fill time. Pedal hair growth present. No varicosities and no lower extremity edema present bilateral.   Neruologic: Grossly intact via light touch bilateral. Vibratory intact via tuning fork bilateral. Protective threshold with Semmes Wienstein monofilament intact to all pedal sites bilateral. Patellar and Achilles deep tendon reflexes 2+ bilateral. No Babinski or clonus noted bilateral.   Musculoskeletal: No gross boney pedal deformities bilateral. No pain, crepitus, or limitation noted with foot and ankle range of motion bilateral. Muscular strength 5/5 in all groups tested bilateral.  She has pain on palpation and range of motion of the second metatarsophalangeal joint of the right foot.  Pain on end range of motion.  She also has pain on palpation medial calcaneal tubercle of the left heel.    Gait: Unassisted, Nonantalgic.    Radiographs: Radiographs taken today demonstrate osseously mature individual with small plantar and posterior distally and proximally oriented calcaneal heel spurs bilateral soft tissue increase in density plantar fascial calcaneal insertion site bilateral with intraligamentous calcification at the insertion site consistent with chronic tears.  Mild hallux valgus deformity of the right foot with hammertoe deformity development  second right mild medial deviation right.  Contracture at the metatarsophalangeal joint and PIPJ.    Assessment & Plan:   Assessment: Capsulitis with hammertoe deformity second right.  Planter fasciitis left.  Plan: I injected the heel today 20 mg Kenalog 5 mg Marcaine point maximal tenderness plantar fascial Caney insertion site left foot.  I injected directly into the second metatarsophalangeal joint today after sterile Betadine skin prep.  A total of 2 mg was injected with local anesthetic.  Tolerated procedure well.  Discussed appropriate shoe gear stretching exercises ice therapy and shoe gear modifications.     Janit Cutter T. [place], [place]

## 2021-10-24 LAB — COLOGUARD: COLOGUARD: NEGATIVE

## 2021-11-12 ENCOUNTER — Ambulatory Visit (INDEPENDENT_AMBULATORY_CARE_PROVIDER_SITE_OTHER): Payer: Medicare Other | Admitting: Medical

## 2021-11-12 ENCOUNTER — Other Ambulatory Visit: Payer: Medicare Other

## 2021-11-12 VITALS — BP 110/68 | HR 78 | Temp 97.9°F | Resp 16 | Wt 173.6 lb

## 2021-11-12 DIAGNOSIS — R059 Cough, unspecified: Secondary | ICD-10-CM

## 2021-11-12 DIAGNOSIS — J029 Acute pharyngitis, unspecified: Secondary | ICD-10-CM | POA: Diagnosis not present

## 2021-11-12 DIAGNOSIS — J988 Other specified respiratory disorders: Secondary | ICD-10-CM

## 2021-11-12 LAB — POCT INFLUENZA A/B
Influenza A, POC: NEGATIVE
Influenza B, POC: NEGATIVE

## 2021-11-12 LAB — POC COVID19 BINAXNOW: SARS Coronavirus 2 Ag: NEGATIVE

## 2021-11-12 MED ORDER — AMOXICILLIN 875 MG PO TABS: 875.0000 mg | ORAL_TABLET | Freq: Two times a day (BID) | ORAL | 0 refills | Status: DC

## 2021-11-12 NOTE — Progress Notes (Signed)
Subjective:  Cassandra Hendricks is a 71 y.o. female who presents for Chief Complaint  Patient presents with   Sore Throat    Sore throat since thursday  cough, runny nose since saturday   Cough     Here for illness.  Symptom began 6 days ago.   Started with stomach cramps and diarrhea, but within a day or 2 sore throat, awful cough.  Cough and sore throat have continued.   Doesn't feel horrible.   Has some runny nose.   Green productive sputum and nasal discharge.  No fever, no body aches or chills.   Had some nausea, but no vomiting.  No sick contacts.   Using sore throat spray only.   No other aggravating or relieving factors.    Since last visit cut out all white foods, rice and pasta, and has lost 10 lb recently  No other c/o.  Past Medical History:  Diagnosis Date   Chronic neck pain    per med record from [place]   Female bladder prolapse    Herpes labialis    History of prediabetes 11/21/2015   Hemoglobin A1c 5.9% 05/10/2015. Prior to that 6.1% on 12/21/2014   History of thrombocytopenia    HTN (hypertension)    controlled with HCTZ and metoprolol, DASH. EKG 05/2015 NSR, no abnormalities   Mixed hyperlipidemia    per records from 05/2015 in [place]. diet and exercise   Osteopenia determined by x-ray    DEXA 05/30/2014 in [place] [facility]    Current Outpatient Medications on File Prior to Visit  Medication Sig Dispense Refill   Ascorbic Acid (VITAMIN C PO) Take by mouth.     Calcium Carbonate (CALCIUM 500 PO) Take 1 tablet by mouth daily.     celecoxib (CELEBREX) 100 MG capsule Take 100 mg by mouth daily as needed for moderate pain.     glucosamine-chondroitin 500-400 MG tablet Take 1 tablet by mouth 3 (three) times daily.     hydrochlorothiazide (HYDRODIURIL) 25 MG tablet Take 1 tablet (25 mg total) by mouth daily. 90 tablet 1   metoprolol tartrate (LOPRESSOR) 50 MG tablet Take 1 tablet (50 mg total) by mouth 2 (two) times daily. 180 tablet 1   Multiple  Vitamin (MULTIVITAMIN) tablet Take 1 tablet by mouth daily.     valACYclovir (VALTREX) 500 MG tablet Take 1 tablet by mouth once daily 90 tablet 0   zinc gluconate 50 MG tablet Take 50 mg by mouth daily.     No current facility-administered medications on file prior to visit.     The following portions of the patient's history were reviewed and updated as appropriate: allergies, current medications, past family history, past medical history, past social history, past surgical history and problem list.  ROS Otherwise as in subjective above    Objective: BP 110/68   Pulse 78   Temp 97.9 F (36.6 C)   Resp 16   Wt 173 lb 9.6 oz (78.7 kg)   SpO2 98%   BMI 31.25 kg/m   Wt Readings from Last 3 Encounters:  11/12/21 173 lb 9.6 oz (78.7 kg)  10/01/21 183 lb 3.2 oz (83.1 kg)  03/19/21 185 lb 6.4 oz (84.1 kg)   General appearance: alert, no distress, well developed, well nourished HEENT: normocephalic, sclerae anicteric, conjunctiva pink and moist, TM flat with some mild erythema, nares patent, no discharge or erythema, pharynx with mild erythema Oral cavity: MMM, no lesions Neck: supple, no lymphadenopathy, no  thyromegaly, no masses Heart: RRR, normal S1, S2, no murmurs Lungs: CTA bilaterally, no wheezes, rhonchi, or rales    Assessment: Encounter Diagnoses  Name Primary?   Sore throat Yes   Cough, unspecified type    Respiratory tract infection      Plan: Discussed symptoms more suggestive of sinusitis and drainage.  Begin medications below, continue robitussin DM, rest, hydration, continue sore throat spray and salt water gargles.  If not much improved this week, then call back  Congratulated her on the recent weight loss  Cassandra Hendricks was seen today for sore throat and cough.  Diagnoses and all orders for this visit:  Sore throat -     POC COVID-19 -     Influenza A/B -     amoxicillin (AMOXIL) 875 MG tablet; Take 1 tablet (875 mg total) by mouth 2 (two) times  daily for 10 days.  Cough, unspecified type -     POC COVID-19 -     Influenza A/B -     amoxicillin (AMOXIL) 875 MG tablet; Take 1 tablet (875 mg total) by mouth 2 (two) times daily for 10 days.  Respiratory tract infection -     amoxicillin (AMOXIL) 875 MG tablet; Take 1 tablet (875 mg total) by mouth 2 (two) times daily for 10 days.   Follow up: prn

## 2021-11-20 ENCOUNTER — Telehealth: Payer: Self-pay | Admitting: *Deleted

## 2021-11-20 ENCOUNTER — Other Ambulatory Visit: Payer: Self-pay | Admitting: Medical

## 2021-11-20 MED ORDER — AZITHROMYCIN 250 MG PO TABS: ORAL_TABLET | ORAL | 0 refills | Status: DC

## 2021-11-20 NOTE — Telephone Encounter (Signed)
Patient called and tomorrow will be day 10 of amoxil-she is still blowing out and coughing up green discolored mucus. Would like to know if you want to change her abx or maybe extend?

## 2021-11-20 NOTE — Telephone Encounter (Signed)
Spoke with patient and let her know.  

## 2021-11-22 ENCOUNTER — Encounter: Payer: Self-pay | Admitting: Podiatry

## 2021-11-22 ENCOUNTER — Ambulatory Visit (INDEPENDENT_AMBULATORY_CARE_PROVIDER_SITE_OTHER): Payer: Medicare Other | Admitting: Podiatry

## 2021-11-22 DIAGNOSIS — M778 Other enthesopathies, not elsewhere classified: Secondary | ICD-10-CM

## 2021-11-22 DIAGNOSIS — L603 Nail dystrophy: Secondary | ICD-10-CM

## 2021-11-22 DIAGNOSIS — M722 Plantar fascial fibromatosis: Secondary | ICD-10-CM

## 2021-11-22 MED ORDER — TRIAMCINOLONE ACETONIDE 40 MG/ML IJ SUSP
40.0000 mg | Freq: Once | INTRAMUSCULAR | Status: AC
  Administered 2021-11-22: 40 mg

## 2021-11-22 NOTE — Progress Notes (Signed)
She presents for follow-up of her capsulitis and her Planter fasciitis.  Capsulitis to the second metatarsophalangeal joint of the right foot is the most painful.  She states that the left heel is doing better and she is also wanting to have her nails sampled today.  Objective: Vital signs are stable oriented x3.  Pulses are palpable.  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  This is resulting in capsulitis and tenderness on end range of motion of the joint.  Left heel is painful on palpation medial calcaneal tubercle more proximal than previously noted.  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  Assessment: Capsulitis second metatarsophalangeal joint secondary to forefoot deformities.  Planter fasciitis left heel improved considerably.  Nail dystrophy first second and third digits right foot.  Plan: Discussed etiology pathology conservative surgical therapies performed a periarticular injection with 10 mg of Kenalog 5 mg Marcaine to the point of maximum maximal tenderness around the second metatarsal phalangeal joint.  I injected the left heel 20 mg Kenalog 5 mg Marcaine point of maximal tenderness left heel.  I took samples of the toenails in question today to be sent for pathologic evaluation we will follow-up with her in 6 weeks.

## 2021-11-23 ENCOUNTER — Telehealth: Payer: Self-pay | Admitting: Medical

## 2021-11-23 NOTE — Telephone Encounter (Signed)
Pt called and advised 

## 2021-11-23 NOTE — Telephone Encounter (Signed)
Pt called and states that the today the zpack has made her dizzy and nauseous. She wants to know if she should continue taking. She has two more days. Pt can be reached at [phone].

## 2021-11-26 ENCOUNTER — Ambulatory Visit (INDEPENDENT_AMBULATORY_CARE_PROVIDER_SITE_OTHER): Payer: Medicare Other | Admitting: Medical

## 2021-11-26 VITALS — BP 110/64 | HR 57 | Wt 169.4 lb

## 2021-11-26 DIAGNOSIS — D696 Thrombocytopenia, unspecified: Secondary | ICD-10-CM

## 2021-11-26 DIAGNOSIS — K219 Gastro-esophageal reflux disease without esophagitis: Secondary | ICD-10-CM

## 2021-11-26 DIAGNOSIS — E119 Type 2 diabetes mellitus without complications: Secondary | ICD-10-CM

## 2021-11-26 DIAGNOSIS — E782 Mixed hyperlipidemia: Secondary | ICD-10-CM

## 2021-11-26 DIAGNOSIS — I10 Essential (primary) hypertension: Secondary | ICD-10-CM | POA: Diagnosis not present

## 2021-11-26 NOTE — Progress Notes (Signed)
Subjective:  Cassandra Hendricks is a 71 y.o. female who presents for Chief Complaint  Patient presents with   6 week follow-up    6 week follow-up  on Cholesterol and BS. Has last 14 pounds in 8 weeks. Cut out sweets and soda     Here for follow up.   Since 09/2021 getting lab results of new diabetes ,has cut out anything white, no soda, no cakes, pies, cookies, sweets, sugar, white bread, etc.  Has worked hard to get this under control.   She has lost 14lb since 09/2021.  Her brother has diabetes, and they have been taking nas well.    September, labs showed glucose 144, 6.5% HgbA1C, lipids showed, LDL 149, total chlsterol 227.  She realizes now where she wsa getting a lot of sugars and trying to do better.  However, she is concerned since this weekend is Thanksgivign  No other aggravating or relieving factors.    No other c/o.  Past Medical History:  Diagnosis Date   Chronic neck pain    per med record from [place]   Female bladder prolapse    Herpes labialis    History of prediabetes 11/21/2015   Hemoglobin A1c 5.9% 05/10/2015. Prior to that 6.1% on 12/21/2014   History of thrombocytopenia    HTN (hypertension)    controlled with HCTZ and metoprolol, DASH. EKG 05/2015 NSR, no abnormalities   Mixed hyperlipidemia    per records from 05/2015 in [place]. diet and exercise   Osteopenia determined by x-ray    DEXA 05/30/2014 in [place]    Stress incontinence    Current Outpatient Medications on File Prior to Visit  Medication Sig Dispense Refill   Ascorbic Acid (VITAMIN C PO) Take by mouth.     Calcium Carbonate (CALCIUM 500 PO) Take 1 tablet by mouth daily.     celecoxib (CELEBREX) 100 MG capsule Take 100 mg by mouth daily as needed for moderate pain.     glucosamine-chondroitin 500-400 MG tablet Take 1 tablet by mouth 3 (three) times daily.     hydrochlorothiazide (HYDRODIURIL) 25 MG tablet Take 1 tablet (25 mg total) by mouth daily. 90 tablet 1   metoprolol tartrate (LOPRESSOR)  50 MG tablet Take 1 tablet (50 mg total) by mouth 2 (two) times daily. 180 tablet 1   Multiple Vitamin (MULTIVITAMIN) tablet Take 1 tablet by mouth daily.     valACYclovir (VALTREX) 500 MG tablet Take 1 tablet by mouth once daily 90 tablet 0   zinc gluconate 50 MG tablet Take 50 mg by mouth daily.     No current facility-administered medications on file prior to visit.   The following portions of the patient's history were reviewed and updated as appropriate: allergies, current medications, past family history, past medical history, past social history, past surgical history and problem list.  ROS Otherwise as in subjective above  Objective: BP 110/64   Pulse (!) 57   Wt 169 lb 6.4 oz (76.8 kg)   BMI 30.49 kg/m   Wt Readings from Last 3 Encounters:  11/26/21 169 lb 6.4 oz (76.8 kg)  11/12/21 173 lb 9.6 oz (78.7 kg)  10/01/21 183 lb 3.2 oz (83.1 kg)    General appearance: alert, no distress, well developed, well nourished    Assessment: Encounter Diagnoses  Name Primary?   New onset type 2 diabetes mellitus (HCC) Yes   Primary hypertension    Thrombocytopenia (HCC)    Mixed hyperlipidemia    Gastroesophageal reflux disease,  unspecified whether esophagitis present      Plan: We discussed new diagnosis of diabetes type 2 and hyperlipidemia.  We discussed standards of care, glucose testing, possible complications of diabetes, screening for heart disease.  She does not want to add any medications today.  I did congratulate her on her recent 14 pound weight loss and dietary changes to get her sugars under control.  She is already making good steps to get this under control.  She is also seeing improvements in GERD since losing weight.  She does not want to start a glucometer today either.  Consider CT coronary screening test.  F/u 61mo fasting.  Patient Instructions   Type 2 Diabetes Diabetes is a long-lasting (chronic) disease.  With diabetes, either the pancreas does not  make enough of a hormone called insulin, or the body has trouble using the insulin that is made. Over time, diabetes can damage the eyes, kidneys, and nerves causing retinopathy, nephropathy, and neuropathy.  Diabetes puts you at risk for heart disease and peripheral vascular disease which can lead to heart attack, stroke, foot ulcers, and amputations.   Our goal and hopefully your goal is to manage your diabetes in such a way to slow the progression of the disease and do all we can to keep you healthy  Home Care:  Eat healthy, exercise regularly, limit alcohol, and don't smoke! Check your blood sugar (glucose) once a day before breakfast, or as indicated by our discussion today.  The goal is to keep your morning fasting sugar less than 130.  In general you should try to keep your blood sugars between 80 - 130 fasting or before meals. We also want to avoid hypoglycemia or low blood sugars less than 70.  Make sure you understand how to treat hypoglycemic episodes.  Ask me about this if we had not discussed this. If you are on medication, take your medications daily, don't run out of medications. Learn about low blood sugar (hypoglycemia). Know how to treat it. Wear a necklace or bracelet that says you have diabetes in the event of emergency for first responders to identify you have diabetes. Check your feet every night for cuts, sores, blisters, and redness. Tell your medical provider if you have problems. Maintain a normal body weight, or normal BMI - height to weight ratio of 20-25.  Ask me about this.  GET HELP RIGHT AWAY IF: You have trouble keeping your blood sugar in target range. You have problems with your medicines. You are sick and not getting better after 24 hours. You have a sore or wound that is not healing. You have vision problems or changes. You have a fever.   Exercise regularly since it has beneficial effects on the heart and blood sugars. Exercising at least three times per  week or 150 minutes per week can be as important as medication to a diabetic.  Find some form of exercise that you will enjoy doing regularly.  This can include walking, biking, kayaking, golfing, swimming, dance, aerobics, hiking, etc.  If you have joint problems, many local gyms have equipment to accommodate people with specific needs.    Vaccinations:  Diabetics are at increased risk for infection, and illnesses can take longer to resolve.  Current vaccine recommendations include yearly Influenza (flu) vaccine (recommended in October), Pneumococcal vaccine, Hepatitis B vaccine series, Tdap (tetanus, diptheria and pertussis) vaccine every 10 years, Covid vaccination, and other age appropriate vaccinations.     Office visits:  We recommend  routine medical care to make sure we are addressing prevention and issues as they arise.  Typically this could mean twice yearly or up to quarterly depending upon your unique health situation.  Exams should include a yearly physical, a yearly foot exam, and other examination as appropriate.  You should see an eye doctor yearly to help screen for and prevent blood vessel complications in your eyes.  Labs: Diabetics should have blood work done at least twice yearly to monitor your Hemoglobin A1C (a three-month average of your blood sugars) and your cholesterol.  You should have your urine and blood checked yearly to screen for kidney damage.  This may include creatinine and micro-albumin levels.  Other labs as appropriate.    Blood pressure goals:  Goal blood pressure in diabetics should be 130/80 or less. Monitoring your blood pressure with a home blood pressure cuff of your own is an excellent idea.  If you are prescribed medication for blood pressure, take your medication every day, and don't run out of medication.  Having high blood pressure can damage your heart, eyes, kidneys, and put you at risk for heart attack and stroke.  Tobacco use:  If you smoke, dip or chew,  quitting will reduce your risk of heart attack, stroke, peripheral vascular disease, and many cancers.  Other Specialists:   Eye doctor: See your eye doctor yearly for routine eye exam and diabetic eye exam.  Make sure your eye doctor sends [place] a copy of the report  Dentist: See your dentist twice yearly for dental hygiene visits and routine dental care.  Brush and floss your teeth every day.  Dental hygiene is and important part of diabetic care.  Diabetics sometimes see other specialist depending on the complexity of your health issues.    Diabetic Report Card for [facility]  November 26, 2021  Below is a summary of recent tests related to your diabetes that can help you manage your health.   Hemoglobin A1C:  Your Hemoglobin A1C values should be less than 7. If these are greater than 7, you have a higher chance of having eye, heart, and kidney problems in the future.   Your most recent Hemoglobin A1C values were:  Hemoglobin A1C (%)  Date Value  08/14/2020 6.4 (A)   Hgb A1c MFr Bld (%)  Date Value  10/02/2021 6.5 (H)  03/14/2020 6.2 (H)  09/15/2019 6.1 (H)     Cholesterol:  Your LDL Cholesterol (bad cholesterol) values should be less than 100 mg/dL if you do not have cardiovascular disease.  The LDL should be less than 70 mg/dL if you do have cardiovascular disease.  If your LDL is consistently higher than 100 mg/dL, then your risk of heart attack and stroke increases yearly.   Your most recent LDL Cholesterol (bad cholesterol) results were:  LDL Chol Calc (NIH) (mg/dL)  Date Value  51/76/1607 149 (H)  08/14/2020 120 (H)  12/28/2019 106 (H)     Your HDL Cholesterol (good cholesterol) values should be higher than 40 mg/dL.  If your HDL is lower than 40 mg/dL, this increases your risk of heart attack and stroke.    Blood Pressure:  Your blood pressure values should be less than 130/80. Please contact me if your readings at home are consistently higher than this.    Your most recent blood pressure readings at our clinic were:  BP Readings from Last 3 Encounters:  11/26/21 110/64  11/12/21 110/68  10/01/21 120/80    Urine  Protein: Having an elevated microalbumin to creatinine ratio is a marker for early kidney damage due to diabetes or high blood pressure.       Consider glucometer testing with glucometer or Free Style Libre device.       Consider CT coronary calcium test for heart disease screening      Cassandra Hendricks was seen today for 6 week follow-up.  Diagnoses and all orders for this visit:  New onset type 2 diabetes mellitus (HCC)  Primary hypertension  Thrombocytopenia (HCC)  Mixed hyperlipidemia  Gastroesophageal reflux disease, unspecified whether esophagitis present   Follow up: 27mo fasting

## 2021-11-26 NOTE — Patient Instructions (Addendum)
Type 2 Diabetes Diabetes is a long-lasting (chronic) disease.  With diabetes, either the pancreas does not make enough of a hormone called insulin, or the body has trouble using the insulin that is made. Over time, diabetes can damage the eyes, kidneys, and nerves causing retinopathy, nephropathy, and neuropathy.  Diabetes puts you at risk for heart disease and peripheral vascular disease which can lead to heart attack, stroke, foot ulcers, and amputations.   Our goal and hopefully your goal is to manage your diabetes in such a way to slow the progression of the disease and do all we can to keep you healthy  Home Care:  Eat healthy, exercise regularly, limit alcohol, and don't smoke! Check your blood sugar (glucose) once a day before breakfast, or as indicated by our discussion today.  The goal is to keep your morning fasting sugar less than 130.  In general you should try to keep your blood sugars between 80 - 130 fasting or before meals. We also want to avoid hypoglycemia or low blood sugars less than 70.  Make sure you understand how to treat hypoglycemic episodes.  Ask me about this if we had not discussed this. If you are on medication, take your medications daily, don't run out of medications. Learn about low blood sugar (hypoglycemia). Know how to treat it. Wear a necklace or bracelet that says you have diabetes in the event of emergency for first responders to identify you have diabetes. Check your feet every night for cuts, sores, blisters, and redness. Tell your medical provider if you have problems. Maintain a normal body weight, or normal BMI - height to weight ratio of 20-25.  Ask me about this.  GET HELP RIGHT AWAY IF: You have trouble keeping your blood sugar in target range. You have problems with your medicines. You are sick and not getting better after 24 hours. You have a sore or wound that is not healing. You have vision problems or changes. You have a fever.   Exercise  regularly since it has beneficial effects on the heart and blood sugars. Exercising at least three times per week or 150 minutes per week can be as important as medication to a diabetic.  Find some form of exercise that you will enjoy doing regularly.  This can include walking, biking, kayaking, golfing, swimming, dance, aerobics, hiking, etc.  If you have joint problems, many local gyms have equipment to accommodate people with specific needs.    Vaccinations:  Diabetics are at increased risk for infection, and illnesses can take longer to resolve.  Current vaccine recommendations include yearly Influenza (flu) vaccine (recommended in October), Pneumococcal vaccine, Hepatitis B vaccine series, Tdap (tetanus, diptheria and pertussis) vaccine every 10 years, Covid vaccination, and other age appropriate vaccinations.     Office visits:  We recommend routine medical care to make sure we are addressing prevention and issues as they arise.  Typically this could mean twice yearly or up to quarterly depending upon your unique health situation.  Exams should include a yearly physical, a yearly foot exam, and other examination as appropriate.  You should see an eye doctor yearly to help screen for and prevent blood vessel complications in your eyes.  Labs: Diabetics should have blood work done at least twice yearly to monitor your Hemoglobin A1C (a three-month average of your blood sugars) and your cholesterol.  You should have your urine and blood checked yearly to screen for kidney damage.  This may include creatinine and micro-albumin  levels.  Other labs as appropriate.    Blood pressure goals:  Goal blood pressure in diabetics should be 130/80 or less. Monitoring your blood pressure with a home blood pressure cuff of your own is an excellent idea.  If you are prescribed medication for blood pressure, take your medication every day, and don't run out of medication.  Having high blood pressure can damage your  heart, eyes, kidneys, and put you at risk for heart attack and stroke.  Tobacco use:  If you smoke, dip or chew, quitting will reduce your risk of heart attack, stroke, peripheral vascular disease, and many cancers.  Other Specialists:   Eye doctor: See your eye doctor yearly for routine eye exam and diabetic eye exam.  Make sure your eye doctor sends [place] a copy of the report  Dentist: See your dentist twice yearly for dental hygiene visits and routine dental care.  Brush and floss your teeth every day.  Dental hygiene is and important part of diabetic care.  Diabetics sometimes see other specialist depending on the complexity of your health issues.    Diabetic Report Card for [facility]  November 26, 2021  Below is a summary of recent tests related to your diabetes that can help you manage your health.   Hemoglobin A1C:  Your Hemoglobin A1C values should be less than 7. If these are greater than 7, you have a higher chance of having eye, heart, and kidney problems in the future.   Your most recent Hemoglobin A1C values were:  Hemoglobin A1C (%)  Date Value  08/14/2020 6.4 (A)   Hgb A1c MFr Bld (%)  Date Value  10/02/2021 6.5 (H)  03/14/2020 6.2 (H)  09/15/2019 6.1 (H)     Cholesterol:  Your LDL Cholesterol (bad cholesterol) values should be less than 100 mg/dL if you do not have cardiovascular disease.  The LDL should be less than 70 mg/dL if you do have cardiovascular disease.  If your LDL is consistently higher than 100 mg/dL, then your risk of heart attack and stroke increases yearly.   Your most recent LDL Cholesterol (bad cholesterol) results were:  LDL Chol Calc (NIH) (mg/dL)  Date Value  81/44/8185 149 (H)  08/14/2020 120 (H)  12/28/2019 106 (H)     Your HDL Cholesterol (good cholesterol) values should be higher than 40 mg/dL.  If your HDL is lower than 40 mg/dL, this increases your risk of heart attack and stroke.    Blood Pressure:  Your blood pressure  values should be less than 130/80. Please contact me if your readings at home are consistently higher than this.   Your most recent blood pressure readings at our clinic were:  BP Readings from Last 3 Encounters:  11/26/21 110/64  11/12/21 110/68  10/01/21 120/80    Urine Protein: Having an elevated microalbumin to creatinine ratio is a marker for early kidney damage due to diabetes or high blood pressure.       Consider glucometer testing with glucometer or Free Style Libre device.       Consider CT coronary calcium test for heart disease screening

## 2021-12-03 ENCOUNTER — Encounter: Payer: Self-pay | Admitting: Podiatry

## 2021-12-25 ENCOUNTER — Other Ambulatory Visit: Payer: Self-pay | Admitting: Physician Assistant

## 2021-12-25 ENCOUNTER — Other Ambulatory Visit: Payer: Self-pay | Admitting: Medical

## 2021-12-25 DIAGNOSIS — I10 Essential (primary) hypertension: Secondary | ICD-10-CM

## 2021-12-25 DIAGNOSIS — B001 Herpesviral vesicular dermatitis: Secondary | ICD-10-CM

## 2022-01-14 ENCOUNTER — Telehealth: Payer: Self-pay | Admitting: Internal Medicine

## 2022-01-14 NOTE — Telephone Encounter (Signed)
Pt called and wants to know the reasoning of why you want her to consider CT coronary screening test. I do not see order in system

## 2022-01-14 NOTE — Telephone Encounter (Signed)
Pt states as long as insurance will pay for this she can have it done. She does not have any money to pay out of pocket for this. Please put in order

## 2022-01-14 NOTE — Telephone Encounter (Signed)
Left message for pt to call me back 

## 2022-01-15 ENCOUNTER — Ambulatory Visit: Payer: Medicare Other | Admitting: Podiatry

## 2022-01-16 ENCOUNTER — Other Ambulatory Visit: Payer: Self-pay | Admitting: Medical

## 2022-01-16 DIAGNOSIS — Z136 Encounter for screening for cardiovascular disorders: Secondary | ICD-10-CM

## 2022-01-16 DIAGNOSIS — I10 Essential (primary) hypertension: Secondary | ICD-10-CM

## 2022-01-16 DIAGNOSIS — E782 Mixed hyperlipidemia: Secondary | ICD-10-CM

## 2022-01-16 NOTE — Telephone Encounter (Signed)
Pt would like CT coronary test placed so this can be scheduled

## 2022-02-04 ENCOUNTER — Encounter: Payer: Self-pay | Admitting: Podiatry

## 2022-02-04 ENCOUNTER — Ambulatory Visit (INDEPENDENT_AMBULATORY_CARE_PROVIDER_SITE_OTHER): Payer: 59 | Admitting: Podiatry

## 2022-02-04 DIAGNOSIS — L603 Nail dystrophy: Secondary | ICD-10-CM | POA: Diagnosis not present

## 2022-02-04 MED ORDER — TERBINAFINE HCL 250 MG PO TABS: 250.0000 mg | ORAL_TABLET | Freq: Every day | ORAL | 0 refills | Status: DC

## 2022-02-04 NOTE — Progress Notes (Signed)
Presents today for follow-up of her nail pathology.  She is also wondering why her second toe is sitting up in what she can do about it.  Objective: Vital signs are stable she is alert and oriented x 3.  Pulses are palpable.  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  She also has saprophytic fungus.  Plan: Discussed laser therapy and oral therapy she would like to start with oral therapy.  We discussed pros and cons of use of this medication and the fact that we will need to do blood work she understands this and is amenable to it.  I will start her out on Lamisil 250 mg tablets.  She will take 1 tablet daily by mouth.  I have recommended a hammertoe positioner short of surgery.

## 2022-02-04 NOTE — Patient Instructions (Signed)
Terbinafine Tablets What is this medication? TERBINAFINE (TER bin a feen) treats fungal infections of the nails. It belongs to a group of medications called antifungals. It will not treat infections caused by bacteria or viruses. This medicine may be used for other purposes; ask your health care provider or pharmacist if you have questions. COMMON BRAND NAME(S): Lamisil, Terbinex What should I tell my care team before I take this medication? They need to know if you have any of these conditions: Liver disease An unusual or allergic reaction to terbinafine, other medications, foods, dyes, or preservatives Pregnant or trying to get pregnant Breast-feeding How should I use this medication? Take this medication by mouth with water. Take it as directed on the prescription label at the same time every day. You can take it with or without food. If it upsets your stomach, take it with food. Keep taking it unless your care team tells you to stop. A special MedGuide will be given to you by the pharmacist with each prescription and refill. Be sure to read this information carefully each time. Talk to your care team regarding the use of this medication in children. Special care may be needed. Overdosage: If you think you have taken too much of this medicine contact a poison control center or emergency room at once. NOTE: This medicine is only for you. Do not share this medicine with others. What if I miss a dose? If you miss a dose, take it as soon as you can unless it is more than 4 hours late. If it is more than 4 hours late, skip the missed dose. Take the next dose at the normal time. What may interact with this medication? Do not take this medication with any of the following: Pimozide Thioridazine This medication may also interact with the following: Beta blockers Caffeine Certain medications for mental health conditions Cimetidine Cyclosporine Medications for fungal infections like fluconazole  and ketoconazole Medications for irregular heartbeat like amiodarone, flecainide and propafenone Rifampin Warfarin This list may not describe all possible interactions. Give your health care provider a list of all the medicines, herbs, non-prescription drugs, or dietary supplements you use. Also tell them if you smoke, drink alcohol, or use illegal drugs. Some items may interact with your medicine. What should I watch for while using this medication? Visit your care team for regular checks on your progress. You may need blood work while you are taking this medication. It may be some time before you see the benefit from this medication. This medication may cause serious skin reactions. They can happen weeks to months after starting the medication. Contact your care team right away if you notice fevers or flu-like symptoms with a rash. The rash may be red or purple and then turn into blisters or peeling of the skin. Or, you might notice a red rash with swelling of the face, lips or lymph nodes in your neck or under your arms. This medication can make you more sensitive to the sun. Keep out of the sun, If you cannot avoid being in the sun, wear protective clothing and sunscreen. Do not use sun lamps or tanning beds/booths. What side effects may I notice from receiving this medication? Side effects that you should report to your care team as soon as possible: Allergic reactions--skin rash, itching, hives, swelling of the face, lips, tongue, or throat Change in sense of smell Change in taste Infection--fever, chills, cough, or sore throat Liver injury--right upper belly pain, loss of appetite, nausea,  light-colored stool, dark yellow or brown urine, yellowing skin or eyes, unusual weakness or fatigue Low red blood cell level--unusual weakness or fatigue, dizziness, headache, trouble breathing Lupus-like syndrome--joint pain, swelling, or stiffness, butterfly-shaped rash on the face, rashes that get worse  in the sun, fever, unusual weakness or fatigue Rash, fever, and swollen lymph nodes Redness, blistering, peeling, or loosening of the skin, including inside the mouth Unusual bruising or bleeding Worsening mood, feelings of depression Side effects that usually do not require medical attention (report to your care team if they continue or are bothersome): Diarrhea Gas Headache Nausea Stomach pain Upset stomach This list may not describe all possible side effects. Call your doctor for medical advice about side effects. You may report side effects to FDA at 1-800-FDA-1088. Where should I keep my medication? Keep out of the reach of children and pets. Store between 20 and 25 degrees C (68 and 77 degrees F). Protect from light. Get rid of any unused medication after the expiration date. To get rid of medications that are no longer needed or have expired: Take the medication to a medication take-back program. Check with your pharmacy or law enforcement to find a location. If you cannot return the medication, check the label or package insert to see if the medication should be thrown out in the garbage or flushed down the toilet. If you are not sure, ask your care team. If it is safe to put it in the trash, take the medication out of the container. Mix the medication with cat litter, dirt, coffee grounds, or other unwanted substance. Seal the mixture in a bag or container. Put it in the trash. NOTE: This sheet is a summary. It may not cover all possible information. If you have questions about this medicine, talk to your doctor, pharmacist, or health care provider.  2023 Elsevier/Gold Standard (2020-07-18 00:00:00)

## 2022-02-26 ENCOUNTER — Ambulatory Visit (INDEPENDENT_AMBULATORY_CARE_PROVIDER_SITE_OTHER): Payer: 59 | Admitting: *Deleted

## 2022-02-26 DIAGNOSIS — Z79899 Other long term (current) drug therapy: Secondary | ICD-10-CM

## 2022-02-26 DIAGNOSIS — L603 Nail dystrophy: Secondary | ICD-10-CM

## 2022-02-26 MED ORDER — TERBINAFINE HCL 250 MG PO TABS: 250.0000 mg | ORAL_TABLET | Freq: Every day | ORAL | 0 refills | Status: DC

## 2022-02-26 NOTE — Progress Notes (Signed)
Presents today for follow-up of her nail fungus. She is currently taking lamisil.  Patient has taken about 3 weeks of medication. She states some days she just can't stomach the taste of it, so she has missed some doses. She developed some slight stomach discomfort yesterday, but relates this to something she ate.   I will get her a requisition to take with her today for a CMP, however, she will wait to have this done until her PCP appointment in 2 weeks in which she will be having blood work done. Also, I sent over 90 days supply of lamisil.   Advised patient to hold medication for 1 week to allow her stomach discomfort to subside.   She will follow up in 4 months with Dr. Milinda Pointer.

## 2022-03-04 ENCOUNTER — Encounter (HOSPITAL_BASED_OUTPATIENT_CLINIC_OR_DEPARTMENT_OTHER): Payer: Self-pay

## 2022-03-04 ENCOUNTER — Ambulatory Visit (HOSPITAL_BASED_OUTPATIENT_CLINIC_OR_DEPARTMENT_OTHER)
Admission: RE | Admit: 2022-03-04 | Discharge: 2022-03-04 | Disposition: A | Discharge status: Home / Self Care | Payer: Medicare Other | Source: Ambulatory Visit | Attending: Medical | Admitting: Medical

## 2022-03-04 ENCOUNTER — Other Ambulatory Visit: Payer: Self-pay | Admitting: Medical

## 2022-03-04 DIAGNOSIS — E782 Mixed hyperlipidemia: Secondary | ICD-10-CM | POA: Insufficient documentation

## 2022-03-04 DIAGNOSIS — Z136 Encounter for screening for cardiovascular disorders: Secondary | ICD-10-CM | POA: Insufficient documentation

## 2022-03-04 DIAGNOSIS — I10 Essential (primary) hypertension: Secondary | ICD-10-CM

## 2022-03-04 MED ORDER — ROSUVASTATIN CALCIUM 20 MG PO TABS: 20.0000 mg | ORAL_TABLET | Freq: Every day | ORAL | 2 refills | Status: DC

## 2022-03-04 NOTE — Progress Notes (Signed)
Your CT scan shows cholesterol plaques or atherosclerosis in your coronary arteries as well as aorta.  It is recommended that you be on medication to help lower cholesterol and lower risk of potential heart attack and stroke.  I recommend you begin Crestor daily at bedtime to lower that risk if agreeable.  Let me know and I will send this to the pharmacy.  Recommendations for improving lipids:  Foods TO AVOID or limit - fried foods, high sugar foods, white bread, enriched flour, fast food, red meat, large amounts of cheese, processed foods such as little debbie cakes, cookies, pies, donuts, for example  Foods TO INCLUDE in the diet - whole grains such as whole grain pasta, whole grain bread, barley, steel cut oatmeal (not instant oatmeal), avocado, fish, green leafy vegetables, nuts, increased fiber in diet, and using olive oil in small amounts for cooking or as salad dressing vinaigrette.

## 2022-03-05 ENCOUNTER — Telehealth: Payer: Self-pay | Admitting: Internal Medicine

## 2022-03-05 NOTE — Telephone Encounter (Signed)
Pt wants to know if her liver will being on both celebrex and crestor will affect her. She is scared that it will affect her liver and that she will cause weight gain. Pt wants to know if she needs to start on this now. Pt is very nervous about this medication. She has an appt on march 7 and has more questions to ask you

## 2022-03-06 NOTE — Telephone Encounter (Signed)
Pt was notified of results and will wait until next week to discuss further

## 2022-03-11 ENCOUNTER — Telehealth: Payer: Self-pay | Admitting: Medical

## 2022-03-11 MED ORDER — ROSUVASTATIN CALCIUM 20 MG PO TABS: 20.0000 mg | ORAL_TABLET | Freq: Every day | ORAL | 0 refills | Status: DC

## 2022-03-11 NOTE — Telephone Encounter (Signed)
done 

## 2022-03-11 NOTE — Telephone Encounter (Signed)
Pharmacy sent message pt would like script for 90 says for better adherence for her crestor Please send to the [facility] ([place]),  - [address]

## 2022-03-12 ENCOUNTER — Ambulatory Visit: Payer: Medicare Other | Admitting: Medical

## 2022-03-12 DIAGNOSIS — M545 Low back pain, unspecified: Secondary | ICD-10-CM | POA: Diagnosis not present

## 2022-03-14 ENCOUNTER — Ambulatory Visit (INDEPENDENT_AMBULATORY_CARE_PROVIDER_SITE_OTHER): Payer: 59 | Admitting: Medical

## 2022-03-14 ENCOUNTER — Encounter: Payer: Self-pay | Admitting: Medical

## 2022-03-14 VITALS — BP 120/70 | HR 54 | Wt 165.4 lb

## 2022-03-14 DIAGNOSIS — M8580 Other specified disorders of bone density and structure, unspecified site: Secondary | ICD-10-CM

## 2022-03-14 DIAGNOSIS — I700 Atherosclerosis of aorta: Secondary | ICD-10-CM | POA: Insufficient documentation

## 2022-03-14 DIAGNOSIS — Z23 Encounter for immunization: Secondary | ICD-10-CM

## 2022-03-14 DIAGNOSIS — B351 Tinea unguium: Secondary | ICD-10-CM | POA: Diagnosis not present

## 2022-03-14 DIAGNOSIS — Z79899 Other long term (current) drug therapy: Secondary | ICD-10-CM | POA: Diagnosis not present

## 2022-03-14 DIAGNOSIS — E782 Mixed hyperlipidemia: Secondary | ICD-10-CM

## 2022-03-14 DIAGNOSIS — I10 Essential (primary) hypertension: Secondary | ICD-10-CM | POA: Diagnosis not present

## 2022-03-14 DIAGNOSIS — E1165 Type 2 diabetes mellitus with hyperglycemia: Secondary | ICD-10-CM | POA: Diagnosis not present

## 2022-03-14 NOTE — Patient Instructions (Addendum)
Aortic atherosclerosis, coronary artery disease on scan, hyperlipidemia Your scan results from February 2024 do show plaque buildup in your arteries. For this reason you should be on a cholesterol medicine and even consider a baby aspirin 81 mg daily I recommend you restart the medication that has been sent to the pharmacy Recommendations for improving lipids:  Foods TO AVOID or limit - fried foods, high sugar foods, white bread, enriched flour, fast food, red meat, large amounts of cheese, processed foods such as little debbie cakes, cookies, pies, donuts, for example  Foods TO INCLUDE in the diet - whole grains such as whole grain pasta, whole grain bread, barley, steel cut oatmeal (not instant oatmeal), avocado, fish, green leafy vegetables, nuts, increased fiber in diet, and using olive oil in small amounts for cooking or as salad dressing vinaigrette.    Diabetes-diet controlled.  Updated labs today  Hypertension-continue current medication  Osteopenia Please call to schedule your bone density test.   [facility]  [id number] [place], [place] [address]  For your podiatrist we are checking liver and kidney labs today  Limit your Celebrex use due to the risk of the medication long-term  Counseled on the Covid virus vaccine.  Vaccine information sheet given.  Covid vaccine given after consent obtained.     Sample meal plan  Breakfast You may eat 1 of the following Omelette, which can include a small amount of cheese, and vegetables such as peppers, mushrooms, small pieces of [place] or chicken Low sugar yogurt serving which can include some fruit such as berries Egg whites or hard boiled egg and meat (1-2 strips of bacon, or small piece of [place] sausage or [place] bacon)   Mid-morning snack 1 fruit serving such as one of the following: medium-sized apple medium-sized orange, Tangerine 1/2 banana  3/4 cup of fresh  berries or frozen berries  A protein source such as one of the following: 8 almonds  small handful of walnuts or other nuts small piece of cheese, low sugar yogurt   Lunch A protein source such as 1 of the following: 1 serving of beans such as black beans, pinto beans, green beans, or edamame (soy beans) 1 meat serving such as 6 oz or deck of card size serving of fish, skinless chicken, or [place], either grilled or baked preferably.   You can use some pork or beef, but limit this compared to fish, chicken or [place] Vegetable - Half of your plate should be a non-starchy vegetables!  So avoid white potatoes and corn.  Otherwise, eat a large portion of vegetables. Avocado, cucumber, tomato, carrots, greens, lettuce, squash, okra, etc.  Vegetables can include salad with olive oil/vinaigrette dressing   Mid-afternoon snack 1 fruit serving such as one of the following: medium-sized apple medium-sized orange, Tangerine 1/2 banana  3/4 cup of fresh berries or frozen berries  A protein source such as one of the following: 8 almonds  small handful of walnuts or other nuts small piece of cheese, low sugar yogurt   Dinner A protein source such as 1 of the following: 1 serving of beans such as black beans, pinto beans, green beans, or edamame (soy beans) 1 meat serving such as 6 oz or deck of card size serving of fish, skinless chicken, or [place], either grilled or baked preferably.   You can use some pork or beef, but limit this compared to fish, chicken or [place] Vegetable - Half of your  plate should be a non-starchy vegetables!  So avoid white potatoes and corn.  Otherwise, eat a large portion of vegetables. Avocado, cucumber, tomato, carrots, greens, lettuce, squash, okra, etc.  Vegetables can include salad with olive oil/vinaigrette dressing   Beverages: Water Unsweet tea Home made juice with a juicer without sugar added other than small bit of honey or agave nectar Water with  sugar free flavor such as Mio   AVOID.... For the time being I want you to cut out the following items completely: Soda, sweet tea, juice, beer or wine or alcohol ALL grains and breads including rice, pasta, bread, cereal Sweets such as cake, candy, pies, chips, cookies, chocolate

## 2022-03-14 NOTE — Progress Notes (Signed)
Subjective:  Cassandra Hendricks is a 72 y.o. female who presents for Chief Complaint  Patient presents with   3 month follow-up    3 month follow-up, discuss labs and scan. On 3 different meds and wants to know if it will affect kidney levels     Here for 75-monthfollow-up.  New diabetes diagnosed this past fall, currently diet controlled  Hyperlipidemia, coronary artery disease and atherosclerosis on CT scan-not compliant with statin.  She really does not want to be on medications in general  Diabetes-we discussed her new diagnosis last visit in November 2023.  She has been trying to use diet and exercise to control sugars.  Here for recheck on labs.  Hypertension-compliant with hydrochlorothiazide 25 mg daily, metoprolol 50 mg twice daily  She is taking Lamisil per podiatry.  They want her to have some labs today to recheck liver and kidney.  No other aggravating or relieving factors.    No other c/o.   Past Medical History:  Diagnosis Date   Chronic neck pain    per med record from [place]   Female bladder prolapse    Herpes labialis    History of prediabetes 11/21/2015   Hemoglobin A1c 5.9% 05/10/2015. Prior to that 6.1% on 12/21/2014   History of thrombocytopenia    HTN (hypertension)    controlled with HCTZ and metoprolol, DASH. EKG 05/2015 NSR, no abnormalities   Mixed hyperlipidemia    [place] records from 05/[place] in [place] diet and exercise   Osteopenia determined by x-ray    [place] [place]    Current Outpatient Medications on File Prior to Visit  Medication Sig Dispense Refill   Ascorbic Acid (VITAMIN C PO) Take by mouth.     Calcium Carbonate (CALCIUM 500 PO) Take 1 tablet by mouth daily.     celecoxib (CELEBREX) 100 MG capsule Take 100 mg by mouth daily as needed for moderate pain.     glucosamine-chondroitin 500-400 MG tablet Take 1 tablet by mouth 3 (three) times daily.     hydrochlorothiazide (HYDRODIURIL) 25 MG tablet Take 1  tablet by mouth once daily 90 tablet 0   metoprolol tartrate (LOPRESSOR) 50 MG tablet Take 1 tablet by mouth twice daily 180 tablet 0   Multiple Vitamin (MULTIVITAMIN) tablet Take 1 tablet by mouth daily.     terbinafine (LAMISIL) 250 MG tablet Take 1 tablet (250 mg total) by mouth daily. 90 tablet 0   valACYclovir (VALTREX) 500 MG tablet Take 1 tablet by mouth once daily 90 tablet 0   zinc gluconate 50 MG tablet Take 50 mg by mouth daily.     rosuvastatin (CRESTOR) 20 MG tablet Take 1 tablet (20 mg total) by mouth daily. (Patient not taking: Reported on 03/14/2022) 90 tablet 0   No current facility-administered medications on file prior to visit.    The following portions of the patient's history were reviewed and updated as appropriate: allergies, current medications, past family history, past medical history, past social history, past surgical history and problem list.  ROS Otherwise as in subjective above  Objective: BP 120/70   Pulse (!) 54   Wt 165 lb 6.4 oz (75 kg)   BMI 29.77 kg/m   General appearance: alert, no distress, well developed, well nourished Neck: supple, no lymphadenopathy, no thyromegaly, no masses Heart: RRR, normal S1, S2, no murmurs Lungs: CTA bilaterally, no wheezes, rhonchi, or rales Pulses: 2+ radial pulses, 2+ pedal pulses, normal cap refill  Ext: no edema   Assessment: Encounter Diagnoses  Name Primary?   Type 2 diabetes mellitus with hyperglycemia, unspecified whether long term insulin use (HCC) Yes   Aortic atherosclerosis (HCC)    Primary hypertension    Mixed hyperlipidemia    Osteopenia determined by x-ray    Onychomycosis    High risk medication use    Need for COVID-19 vaccine      Plan: Aortic atherosclerosis, coronary artery disease on scan, hyperlipidemia Your scan results from February 2024 do show plaque buildup in your arteries. For this reason you should be on a cholesterol medicine and even consider a baby aspirin 81 mg daily I  recommend you restart the medication that has been sent to the pharmacy Recommendations for improving lipids:  Foods TO AVOID or limit - fried foods, high sugar foods, white bread, enriched flour, fast food, red meat, large amounts of cheese, processed foods such as little debbie cakes, cookies, pies, donuts, for example  Foods TO INCLUDE in the diet - whole grains such as whole grain pasta, whole grain bread, barley, steel cut oatmeal (not instant oatmeal), avocado, fish, green leafy vegetables, nuts, increased fiber in diet, and using olive oil in small amounts for cooking or as salad dressing vinaigrette.    Diabetes-diet controlled.  Updated labs today  Hypertension-continue current medication  Osteopenia Please call to schedule your bone density test.   [facility]  [id number] [place], [place] [address]  For your podiatrist we are checking liver and kidney labs today  Limit your Celebrex use due to the risk of the medication long-term  Counseled on the Covid virus vaccine.  Vaccine information sheet given.  Covid vaccine given after consent obtained.     [name] was seen today for 3 month follow-up.  Diagnoses and all orders for this visit:  Type 2 diabetes mellitus with hyperglycemia, unspecified whether long term insulin use (HCC) -     Microalbumin/Creatinine Ratio, Urine -     Hemoglobin A1c -     Comprehensive metabolic panel  Aortic atherosclerosis (HCC) -     Lipid panel  Primary hypertension  Mixed hyperlipidemia -     Lipid panel  Osteopenia determined by x-ray  Onychomycosis  High risk medication use -     Comprehensive metabolic panel  Need for [id number] vaccine -     Pfizer Fall 2023 Covid-19 Vaccine 63yr and older    Follow up: Pending labs

## 2022-03-15 ENCOUNTER — Other Ambulatory Visit: Payer: Self-pay | Admitting: Medical

## 2022-03-15 MED ORDER — ROSUVASTATIN CALCIUM 20 MG PO TABS: 20.0000 mg | ORAL_TABLET | Freq: Every day | ORAL | 1 refills | Status: DC

## 2022-03-15 NOTE — Progress Notes (Signed)
Results sent through MyChart

## 2022-03-16 LAB — COMPREHENSIVE METABOLIC PANEL
ALT: 29 IU/L (ref 0–32)
AST: 28 IU/L (ref 0–40)
Albumin/Globulin Ratio: 2.3 — ABNORMAL HIGH (ref 1.2–2.2)
Albumin: 4.5 g/dL (ref 3.8–4.8)
Alkaline Phosphatase: 71 IU/L (ref 44–121)
BUN/Creatinine Ratio: 33 — ABNORMAL HIGH (ref 12–28)
BUN: 18 mg/dL (ref 8–27)
Bilirubin Total: 0.6 mg/dL (ref 0.0–1.2)
CO2: 23 mmol/L (ref 20–29)
Calcium: 9.9 mg/dL (ref 8.7–10.3)
Chloride: 104 mmol/L (ref 96–106)
Creatinine, Ser: 0.55 mg/dL — ABNORMAL LOW (ref 0.57–1.00)
Globulin, Total: 2 g/dL (ref 1.5–4.5)
Glucose: 136 mg/dL — ABNORMAL HIGH (ref 70–99)
Potassium: 3.9 mmol/L (ref 3.5–5.2)
Sodium: 142 mmol/L (ref 134–144)
Total Protein: 6.5 g/dL (ref 6.0–8.5)
eGFR: 97 mL/min/{1.73_m2} (ref >59)

## 2022-03-16 LAB — LIPID PANEL
Chol/HDL Ratio: 3.5 ratio (ref 0.0–4.4)
Cholesterol, Total: 207 mg/dL — ABNORMAL HIGH (ref 100–199)
HDL: 59 mg/dL (ref >39)
LDL Chol Calc (NIH): 132 mg/dL — ABNORMAL HIGH (ref 0–99)
Triglycerides: 87 mg/dL (ref 0–149)
VLDL Cholesterol Cal: 16 mg/dL (ref 5–40)

## 2022-03-16 LAB — HEMOGLOBIN A1C
Est. average glucose Bld gHb Est-mCnc: 126 mg/dL
Hgb A1c MFr Bld: 6 % — ABNORMAL HIGH (ref 4.8–5.6)

## 2022-03-16 LAB — MICROALBUMIN / CREATININE URINE RATIO
Creatinine, Urine: 74.1 mg/dL
Microalb/Creat Ratio: 5 mg/g creat (ref 0–29)
Microalbumin, Urine: 3.4 ug/mL

## 2022-03-18 NOTE — Progress Notes (Signed)
Results sent through MyChart

## 2022-03-19 DIAGNOSIS — M545 Low back pain, unspecified: Secondary | ICD-10-CM | POA: Diagnosis not present

## 2022-03-26 DIAGNOSIS — M545 Low back pain, unspecified: Secondary | ICD-10-CM | POA: Diagnosis not present

## 2022-03-31 ENCOUNTER — Other Ambulatory Visit: Payer: Self-pay | Admitting: Medical

## 2022-03-31 DIAGNOSIS — I10 Essential (primary) hypertension: Secondary | ICD-10-CM

## 2022-03-31 DIAGNOSIS — B001 Herpesviral vesicular dermatitis: Secondary | ICD-10-CM

## 2022-04-02 DIAGNOSIS — M545 Low back pain, unspecified: Secondary | ICD-10-CM | POA: Diagnosis not present

## 2022-04-04 ENCOUNTER — Telehealth: Payer: Self-pay | Admitting: Medical

## 2022-04-04 NOTE — Telephone Encounter (Signed)
Willow Dallman to schedule their annual wellness visit. Appointment made for 04/16/22.  [facility] [employer] [facility] direct phone # [phone]

## 2022-04-09 DIAGNOSIS — M545 Low back pain, unspecified: Secondary | ICD-10-CM | POA: Diagnosis not present

## 2022-04-15 ENCOUNTER — Ambulatory Visit
Admission: RE | Admit: 2022-04-15 | Discharge: 2022-04-15 | Disposition: A | Discharge status: Home / Self Care | Payer: 59 | Source: Ambulatory Visit | Attending: Medical | Admitting: Medical

## 2022-04-15 ENCOUNTER — Other Ambulatory Visit: Payer: Self-pay | Admitting: Medical

## 2022-04-15 ENCOUNTER — Ambulatory Visit
Admission: RE | Admit: 2022-04-15 | Discharge: 2022-04-15 | Disposition: A | Discharge status: Home / Self Care | Payer: Medicaid Other | Source: Ambulatory Visit | Attending: Medical | Admitting: Medical

## 2022-04-15 DIAGNOSIS — Z23 Encounter for immunization: Secondary | ICD-10-CM

## 2022-04-15 DIAGNOSIS — Z1211 Encounter for screening for malignant neoplasm of colon: Secondary | ICD-10-CM

## 2022-04-15 DIAGNOSIS — Z862 Personal history of diseases of the blood and blood-forming organs and certain disorders involving the immune mechanism: Secondary | ICD-10-CM

## 2022-04-15 DIAGNOSIS — D696 Thrombocytopenia, unspecified: Secondary | ICD-10-CM

## 2022-04-15 DIAGNOSIS — B001 Herpesviral vesicular dermatitis: Secondary | ICD-10-CM

## 2022-04-15 DIAGNOSIS — Z780 Asymptomatic menopausal state: Secondary | ICD-10-CM | POA: Diagnosis not present

## 2022-04-15 DIAGNOSIS — R7303 Prediabetes: Secondary | ICD-10-CM

## 2022-04-15 DIAGNOSIS — Z8249 Family history of ischemic heart disease and other diseases of the circulatory system: Secondary | ICD-10-CM

## 2022-04-15 DIAGNOSIS — M85852 Other specified disorders of bone density and structure, left thigh: Secondary | ICD-10-CM | POA: Diagnosis not present

## 2022-04-15 DIAGNOSIS — Z833 Family history of diabetes mellitus: Secondary | ICD-10-CM

## 2022-04-15 DIAGNOSIS — E782 Mixed hyperlipidemia: Secondary | ICD-10-CM

## 2022-04-15 DIAGNOSIS — Z1231 Encounter for screening mammogram for malignant neoplasm of breast: Secondary | ICD-10-CM

## 2022-04-15 DIAGNOSIS — I10 Essential (primary) hypertension: Secondary | ICD-10-CM

## 2022-04-15 DIAGNOSIS — M8580 Other specified disorders of bone density and structure, unspecified site: Secondary | ICD-10-CM

## 2022-04-15 NOTE — Progress Notes (Signed)
Results sent through MyChart

## 2022-04-16 ENCOUNTER — Ambulatory Visit (INDEPENDENT_AMBULATORY_CARE_PROVIDER_SITE_OTHER): Payer: 59

## 2022-04-16 VITALS — Ht 62.0 in | Wt 162.0 lb

## 2022-04-16 DIAGNOSIS — M545 Low back pain, unspecified: Secondary | ICD-10-CM | POA: Diagnosis not present

## 2022-04-16 DIAGNOSIS — Z0000 Encounter for general adult medical examination without abnormal findings: Secondary | ICD-10-CM

## 2022-04-16 NOTE — Patient Instructions (Signed)
Cassandra Hendricks , Thank you for taking time to come for your Medicare Wellness Visit. I appreciate your ongoing commitment to your health goals. Please review the following plan we discussed and let me know if I can assist you in the future.   These are the goals we discussed:  Goals      Patient Stated     04/16/2022, wants to start walking when weather gets better        This is a list of the screening recommended for you and due dates:  Health Maintenance  Topic Date Due   Complete foot exam   Never done   Eye exam for diabetics  Never done   Colon Cancer Screening  Never done   Pneumonia Vaccine (2 of 2 - PPSV23 or PCV20) 07/02/2018   COVID-19 Vaccine (6 - 2023-24 season) 05/09/2022   Flu Shot  08/08/2022   Hemoglobin A1C  09/14/2022   Yearly kidney function blood test for diabetes  03/14/2023   Yearly kidney health urinalysis for diabetes  03/14/2023   Mammogram  04/10/2023   Medicare Annual Wellness Visit  04/16/2023   DTaP/Tdap/Td vaccine (2 - Td or Tdap) 05/12/2026   DEXA scan (bone density measurement)  Completed   Hepatitis C Screening: USPSTF Recommendation to screen - Ages 57-79 yo.  Completed   Zoster (Shingles) Vaccine  Completed   HPV Vaccine  Aged Out    Advanced directives: Advance directive discussed with you today. .  Conditions/risks identified: none  Next appointment: Follow up in one year for your annual wellness visit    Preventive Care 65 Years and Older, Female Preventive care refers to lifestyle choices and visits with your health care provider that can promote health and wellness. What does preventive care include? A yearly physical exam. This is also called an annual well check. Dental exams once or twice a year. Routine eye exams. Ask your health care provider how often you should have your eyes checked. Personal lifestyle choices, including: Daily care of your teeth and gums. Regular physical activity. Eating a healthy diet. Avoiding tobacco  and drug use. Limiting alcohol use. Practicing safe sex. Taking low-dose aspirin every day. Taking vitamin and mineral supplements as recommended by your health care provider. What happens during an annual well check? The services and screenings done by your health care provider during your annual well check will depend on your age, overall health, lifestyle risk factors, and family history of disease. Counseling  Your health care provider may ask you questions about your: Alcohol use. Tobacco use. Drug use. Emotional well-being. Home and relationship well-being. Sexual activity. Eating habits. History of falls. Memory and ability to understand (cognition). Work and work Astronomer. Reproductive health. Screening  You may have the following tests or measurements: Height, weight, and BMI. Blood pressure. Lipid and cholesterol levels. These may be checked every 5 years, or more frequently if you are over 28 years old. Skin check. Lung cancer screening. You may have this screening every year starting at age 65 if you have a 30-pack-year history of smoking and currently smoke or have quit within the past 15 years. Fecal occult blood test (FOBT) of the stool. You may have this test every year starting at age 4. Flexible sigmoidoscopy or colonoscopy. You may have a sigmoidoscopy every 5 years or a colonoscopy every 10 years starting at age 70. Hepatitis C blood test. Hepatitis B blood test. Sexually transmitted disease (STD) testing. Diabetes screening. This is done by checking your  blood sugar (glucose) after you have not eaten for a while (fasting). You may have this done every 1-3 years. Bone density scan. This is done to screen for osteoporosis. You may have this done starting at age 4. Mammogram. This may be done every 1-2 years. Talk to your health care provider about how often you should have regular mammograms. Talk with your health care provider about your test results,  treatment options, and if necessary, the need for more tests. Vaccines  Your health care provider may recommend certain vaccines, such as: Influenza vaccine. This is recommended every year. Tetanus, diphtheria, and acellular pertussis (Tdap, Td) vaccine. You may need a Td booster every 10 years. Zoster vaccine. You may need this after age 23. Pneumococcal 13-valent conjugate (PCV13) vaccine. One dose is recommended after age 57. Pneumococcal polysaccharide (PPSV23) vaccine. One dose is recommended after age 46. Talk to your health care provider about which screenings and vaccines you need and how often you need them. This information is not intended to replace advice given to you by your health care provider. Make sure you discuss any questions you have with your health care provider. Document Released: 01/20/2015 Document Revised: 09/13/2015 Document Reviewed: 10/25/2014 Elsevier Interactive Patient Education  2017 [place] Prevention in the Home Falls can cause injuries. They can happen to people of all ages. There are many things you can do to make your home safe and to help prevent falls. What can I do on the outside of my home? Regularly fix the edges of walkways and driveways and fix any cracks. Remove anything that might make you trip as you walk through a door, such as a raised step or threshold. Trim any bushes or trees on the path to your home. Use bright outdoor lighting. Clear any walking paths of anything that might make someone trip, such as rocks or tools. Regularly check to see if handrails are loose or broken. Make sure that both sides of any steps have handrails. Any raised decks and porches should have guardrails on the edges. Have any leaves, snow, or ice cleared regularly. Use sand or salt on walking paths during winter. Clean up any spills in your garage right away. This includes oil or grease spills. What can I do in the bathroom? Use night  lights. Install grab bars by the toilet and in the tub and shower. Do not use towel bars as grab bars. Use non-skid mats or decals in the tub or shower. If you need to sit down in the shower, use a plastic, non-slip stool. Keep the floor dry. Clean up any water that spills on the floor as soon as it happens. Remove soap buildup in the tub or shower regularly. Attach bath mats securely with double-sided non-slip rug tape. Do not have throw rugs and other things on the floor that can make you trip. What can I do in the bedroom? Use night lights. Make sure that you have a light by your bed that is easy to reach. Do not use any sheets or blankets that are too big for your bed. They should not hang down onto the floor. Have a firm chair that has side arms. You can use this for support while you get dressed. Do not have throw rugs and other things on the floor that can make you trip. What can I do in the kitchen? Clean up any spills right away. Avoid walking on wet floors. Keep items that you use a lot in  easy-to-reach places. If you need to reach something above you, use a strong step stool that has a grab bar. Keep electrical cords out of the way. Do not use floor polish or wax that makes floors slippery. If you must use wax, use non-skid floor wax. Do not have throw rugs and other things on the floor that can make you trip. What can I do with my stairs? Do not leave any items on the stairs. Make sure that there are handrails on both sides of the stairs and use them. Fix handrails that are broken or loose. Make sure that handrails are as long as the stairways. Check any carpeting to make sure that it is firmly attached to the stairs. Fix any carpet that is loose or worn. Avoid having throw rugs at the top or bottom of the stairs. If you do have throw rugs, attach them to the floor with carpet tape. Make sure that you have a light switch at the top of the stairs and the bottom of the stairs. If  you do not have them, ask someone to add them for you. What else can I do to help prevent falls? Wear shoes that: Do not have high heels. Have rubber bottoms. Are comfortable and fit you well. Are closed at the toe. Do not wear sandals. If you use a stepladder: Make sure that it is fully opened. Do not climb a closed stepladder. Make sure that both sides of the stepladder are locked into place. Ask someone to hold it for you, if possible. Clearly mark and make sure that you can see: Any grab bars or handrails. First and last steps. Where the edge of each step is. Use tools that help you move around (mobility aids) if they are needed. These include: Canes. Walkers. Scooters. Crutches. Turn on the lights when you go into a dark area. Replace any light bulbs as soon as they burn out. Set up your furniture so you have a clear path. Avoid moving your furniture around. If any of your floors are uneven, fix them. If there are any pets around you, be aware of where they are. Review your medicines with your doctor. Some medicines can make you feel dizzy. This can increase your chance of falling. Ask your doctor what other things that you can do to help prevent falls. This information is not intended to replace advice given to you by your health care provider. Make sure you discuss any questions you have with your health care provider. Document Released: 10/20/2008 Document Revised: 06/01/2015 Document Reviewed: 01/28/2014 Elsevier Interactive Patient Education  2017 [employer].

## 2022-04-16 NOTE — Progress Notes (Signed)
I connected with  [name] on 04/16/22 by a audio enabled telemedicine application and verified that I am speaking with the correct person using two identifiers.  Patient Location: Home  Provider Location: Office/Clinic  I discussed the limitations of evaluation and management by telemedicine. The patient expressed understanding and agreed to proceed.  Subjective:   Cassandra Hendricks is a 72 y.o. female who presents for Medicare Annual (Subsequent) preventive examination.  Review of Systems     Cardiac Risk Factors include: advanced age (>68men, >48 women);dyslipidemia;hypertension     Objective:    Today's Vitals   04/16/22 1356 04/16/22 1357  Weight: 162 lb (73.5 kg)   Height: 5\' 2"  (1.575 m)   PainSc:  4    Body mass index is 29.63 kg/m.     04/16/2022    2:08 PM  Advanced Directives  Does Patient Have a Medical Advance Directive? No    Current Medications (verified) Outpatient Encounter Medications as of 04/16/2022  Medication Sig   Ascorbic Acid (VITAMIN C PO) Take by mouth.   Calcium Carbonate (CALCIUM 500 PO) Take 1 tablet by mouth daily.   celecoxib (CELEBREX) 100 MG capsule Take 100 mg by mouth daily as needed for moderate pain.   glucosamine-chondroitin 500-400 MG tablet Take 1 tablet by mouth 3 (three) times daily.   hydrochlorothiazide (HYDRODIURIL) 25 MG tablet Take 1 tablet by mouth once daily   metoprolol tartrate (LOPRESSOR) 50 MG tablet Take 1 tablet by mouth twice daily   Multiple Vitamin (MULTIVITAMIN) tablet Take 1 tablet by mouth daily.   terbinafine (LAMISIL) 250 MG tablet Take 1 tablet (250 mg total) by mouth daily.   valACYclovir (VALTREX) 500 MG tablet Take 1 tablet by mouth once daily   zinc gluconate 50 MG tablet Take 50 mg by mouth daily.   rosuvastatin (CRESTOR) 20 MG tablet Take 1 tablet (20 mg total) by mouth daily.   No facility-administered encounter medications on file as of 04/16/2022.    Allergies (verified) Codeine and  Z-pak [azithromycin]   History: Past Medical History:  Diagnosis Date   Chronic neck pain    per med record from [place]   Female bladder prolapse    Herpes labialis    History of prediabetes 11/21/2015   Hemoglobin A1c 5.9% 05/10/2015. Prior to that 6.1% on 12/21/2014   History of thrombocytopenia    HTN (hypertension)    controlled with HCTZ and metoprolol, DASH. EKG 05/2015 NSR, no abnormalities   Mixed hyperlipidemia    per records from 05/2015 in [place]. diet and exercise   Osteopenia determined by x-ray    DEXA 05/30/2014 in [place]    Stress incontinence    Past Surgical History:  Procedure Laterality Date   BLADDER REPAIR     2014 in [place]   REPAIR OF RECTAL PROLAPSE     2014   Family History  Problem Relation Age of Onset   Hypertension Mother    COPD Mother    Thyroid disease Mother    Arthritis-Osteo Mother    Diabetes Father    Hypertension Father    Heart attack Father 68   Hypertension Sister    Diabetes Sister    Cancer Sister        lung ca   Diabetes Sister    Hypertension Sister    Diabetes Brother    Heart failure Brother    Stroke Brother    Thyroid disease Brother    Breast cancer Neg Hx  Social History   Socioeconomic History   Marital status: Divorced    Spouse name: Not on file   Number of children: 2   Years of education: Not on file   Highest education level: Not on file  Occupational History   Occupation: retired from Education officer, environmental business  Tobacco Use   Smoking status: Former    Years: 12    Types: Cigarettes    Quit date: 10/07/1992    Years since quitting: 29.5   Smokeless tobacco: Never   Tobacco comments:    light smoker  Vaping Use   Vaping Use: Never used  Substance and Sexual Activity   Alcohol use: No   Drug use: No   Sexual activity: Not Currently  Other Topics Concern   Not on file  Social History Narrative   Not on file   Social Determinants of Health   Financial Resource Strain: Low Risk  (04/15/2022)    Overall Financial Resource Strain (CARDIA)    Difficulty of Paying Living Expenses: Not hard at all  Food Insecurity: No Food Insecurity (04/16/2022)   Hunger Vital Sign    Worried About Running Out of Food in the Last Year: Never true    Ran Out of Food in the Last Year: Never true  Transportation Needs: No Transportation Needs (04/15/2022)   PRAPARE - Administrator, Civil Service (Medical): No    Lack of Transportation (Non-Medical): No  Physical Activity: Insufficiently Active (04/15/2022)   Exercise Vital Sign    Days of Exercise per Week: 2 days    Minutes of Exercise per Session: 20 min  Stress: No Stress Concern Present (04/15/2022)   [employer] of Occupational Health - Occupational Stress Questionnaire    Feeling of Stress : Not at all  Social Connections: Unknown (04/15/2022)   Social Connection and Isolation Panel [NHANES]    Frequency of Communication with Friends and Family: More than three times a week    Frequency of Social Gatherings with Friends and Family: Three times a week    Attends Religious Services: Not on file    Active Member of Clubs or Organizations: No    Attends Banker Meetings: Never    Marital Status: Divorced    Tobacco Counseling Counseling given: Not Answered Tobacco comments: light smoker   Clinical Intake:  Pre-visit preparation completed: Yes  Pain : 0-10 Pain Score: 4  Pain Type: Chronic pain Pain Location: Back Pain Orientation: Lower Pain Descriptors / Indicators: Aching Pain Onset: More than a month ago Pain Frequency: Constant     Nutritional Status: BMI 25 -29 Overweight Nutritional Risks: None Diabetes: Yes  How often do you need to have someone help you when you read instructions, pamphlets, or other written materials from your doctor or pharmacy?: 1 - Never  Diabetic? no  Interpreter Needed?: No  Information entered by :: NAllen LPN   Activities of Daily Living    04/15/2022    5:03 PM   In your present state of health, do you have any difficulty performing the following activities:  Hearing? 0  Vision? 0  Difficulty concentrating or making decisions? 0  Walking or climbing stairs? 0  Dressing or bathing? 0  Doing errands, shopping? 0  Preparing Food and eating ? N  Using the Toilet? N  In the past six months, have you accidently leaked urine? Y  Do you have problems with loss of bowel control? N  Managing your Medications? N  Managing your  Finances? N  Housekeeping or managing your Housekeeping? N    Patient Care Team: Tysinger, Kermit Balo, PA-C as PCP - General (Family Medicine) Elinor Parkinson, DPM as Consulting Physician (Podiatry)  Indicate any recent Medical Services you may have received from other than [facility] providers in the past year (date may be approximate).     Assessment:   This is a routine wellness examination for [place].  Hearing/Vision screen Vision Screening - Comments:: Regular eye exams, [facility]  Dietary issues and exercise activities discussed: Current Exercise Habits: Home exercise routine, Type of exercise: walking, Time (Minutes): 20, Frequency (Times/Week): 2, Weekly Exercise (Minutes/Week): 40   Goals Addressed             This Visit's Progress    Patient Stated       04/16/2022, wants to start walking when weather gets better       Depression Screen    10/01/2021    3:26 PM 03/19/2021    1:41 PM 12/07/2020    2:19 PM 12/28/2019    3:08 PM 09/15/2019    3:10 PM 12/15/2018    2:37 PM 07/01/2017    2:37 PM  PHQ 2/9 Scores  PHQ - 2 Score 0 0 0 0 0 0 0    Fall Risk    04/15/2022    5:03 PM 10/01/2021    3:26 PM 03/19/2021    1:40 PM 12/07/2020    2:19 PM 08/14/2020    2:31 PM  Fall Risk   Falls in the past year? 0 0 0 0 0  Number falls in past yr: 0 0 0 0 0  Injury with Fall? 0 0 0 0 0  Risk for fall due to : Medication side effect No Fall Risks No Fall Risks  No Fall Risks  Follow up Falls prevention  discussed;Education provided;Falls evaluation completed Falls evaluation completed Falls evaluation completed Falls evaluation completed Falls evaluation completed    FALL RISK PREVENTION PERTAINING TO THE HOME:  Any stairs in or around the home? Yes  If so, are there any without handrails? Yes  Home free of loose throw rugs in walkways, pet beds, electrical cords, etc? Yes  Adequate lighting in your home to reduce risk of falls? Yes   ASSISTIVE DEVICES UTILIZED TO PREVENT FALLS:  Life alert? No  Use of a cane, walker or w/c? No  Grab bars in the bathroom? No  Shower chair or bench in shower? No  Elevated toilet seat or a handicapped toilet? No   TIMED UP AND GO:  Was the test performed? No .      Cognitive Function:        04/16/2022    2:11 PM  6CIT Screen  What Year? 0 points  What month? 0 points  What time? 0 points  Count back from 20 0 points  Months in reverse 0 points  Repeat phrase 0 points  Total Score 0 points    Immunizations Immunization History  Administered Date(s) Administered   COVID-19, mRNA, vaccine(Comirnaty)12 years and older 03/14/2022   Fluad Quad(high Dose 65+) 09/23/2018, 09/15/2019, 10/16/2020, 10/01/2021   Influenza Split 10/24/2015   Influenza, High Dose Seasonal PF 11/02/2014, 10/14/2017   Influenza,inj,Quad PF,6+ Mos 10/04/2016   Influenza-Unspecified 10/08/2014   PFIZER(Purple Top)SARS-COV-2 Vaccination 03/07/2019, 03/31/2019, 11/19/2019   Pfizer Covid-19 Vaccine Bivalent Booster 23yrs & up 12/07/2020   Pneumococcal Conjugate-13 07/01/2017   Pneumococcal-Unspecified 12/08/2014   RSV,unspecified 10/19/2021   Tdap 05/11/2016   Zoster  Recombinat (Shingrix) 07/02/2017, 10/14/2017   Zoster, Live 05/08/2015    TDAP status: Up to date  Flu Vaccine status: Up to date  Pneumococcal vaccine status: Up to date  Covid-19 vaccine status: Completed vaccines  Qualifies for Shingles Vaccine? Yes   Zostavax completed Yes   Shingrix  Completed?: Yes  Screening Tests Health Maintenance  Topic Date Due   FOOT EXAM  Never done   OPHTHALMOLOGY EXAM  Never done   COLONOSCOPY (Pts 45-45yrs Insurance coverage will need to be confirmed)  Never done   Pneumonia Vaccine 23+ Years old (2 of 2 - PPSV23 or PCV20) 07/02/2018   Medicare Annual Wellness (AWV)  03/20/2022   COVID-19 Vaccine (6 - 2023-24 season) 05/09/2022   INFLUENZA VACCINE  08/08/2022   HEMOGLOBIN A1C  09/14/2022   Diabetic kidney evaluation - eGFR measurement  03/14/2023   Diabetic kidney evaluation - Urine ACR  03/14/2023   MAMMOGRAM  04/10/2023   DTaP/Tdap/Td (2 - Td or Tdap) 05/12/2026   DEXA SCAN  Completed   Hepatitis C Screening  Completed   Zoster Vaccines- Shingrix  Completed   HPV VACCINES  Aged Out    Health Maintenance  Health Maintenance Due  Topic Date Due   FOOT EXAM  Never done   OPHTHALMOLOGY EXAM  Never done   COLONOSCOPY (Pts 45-54yrs Insurance coverage will need to be confirmed)  Never done   Pneumonia Vaccine 87+ Years old (2 of 2 - PPSV23 or PCV20) 07/02/2018   Medicare Annual Wellness (AWV)  03/20/2022    Colorectal cancer screening: Type of screening: Cologuard. Completed 10/18/2021. Repeat every 3 years  Mammogram status: Completed 04/15/2022. Repeat every year  Bone Density status: Completed 04/15/2022.   Lung Cancer Screening: (Low Dose CT Chest recommended if Age 35-80 years, 30 pack-year currently smoking OR have quit w/in 15years.) does not qualify.   Lung Cancer Screening Referral: no  Additional Screening:  Hepatitis C Screening: does qualify; Completed 09/22/2018  Vision Screening: Recommended annual ophthalmology exams for early detection of glaucoma and other disorders of the eye. Is the patient up to date with their annual eye exam?  Yes  Who is the provider or what is the name of the office in which the patient attends annual eye exams? [facility]  If pt is not established with a provider, would they like to  be referred to a provider to establish care? No .   Dental Screening: Recommended annual dental exams for proper oral hygiene  Community Resource Referral / Chronic Care Management: CRR required this visit?  No   CCM required this visit?  No      Plan:     I have personally reviewed and noted the following in the patient's chart:   Medical and social history Use of alcohol, tobacco or illicit drugs  Current medications and supplements including opioid prescriptions. Patient is not currently taking opioid prescriptions. Functional ability and status Nutritional status Physical activity Advanced directives List of other physicians Hospitalizations, surgeries, and ER visits in previous 12 months Vitals Screenings to include cognitive, depression, and falls Referrals and appointments  In addition, I have reviewed and discussed with patient certain preventive protocols, quality metrics, and best practice recommendations. A written personalized care plan for preventive services as well as general preventive health recommendations were provided to patient.     Barb Merino, LPN   07/13/7339   Nurse Notes: none  Due to this being a virtual visit, the after visit summary with  patients personalized plan was offered to patient via mail or my-chart.  Patient would like to access on my-chart

## 2022-04-17 ENCOUNTER — Telehealth: Payer: Self-pay | Admitting: Medical

## 2022-04-17 NOTE — Progress Notes (Signed)
Results sent through MyChart

## 2022-04-17 NOTE — Telephone Encounter (Signed)
Patient inquired about glucometer.  Discussed starting to check a few days per week.  Her numbers have been stable though.  She is very careful with diet and getting exercise.  Currently she doesn't want to use a glucometer.

## 2022-04-23 DIAGNOSIS — M545 Low back pain, unspecified: Secondary | ICD-10-CM | POA: Diagnosis not present

## 2022-04-29 ENCOUNTER — Ambulatory Visit (INDEPENDENT_AMBULATORY_CARE_PROVIDER_SITE_OTHER): Payer: 59

## 2022-04-29 ENCOUNTER — Ambulatory Visit (INDEPENDENT_AMBULATORY_CARE_PROVIDER_SITE_OTHER): Payer: 59 | Admitting: Podiatry

## 2022-04-29 DIAGNOSIS — M722 Plantar fascial fibromatosis: Secondary | ICD-10-CM

## 2022-04-29 DIAGNOSIS — M79672 Pain in left foot: Secondary | ICD-10-CM | POA: Diagnosis not present

## 2022-04-29 MED ORDER — TRIAMCINOLONE ACETONIDE 10 MG/ML IJ SUSP
10.0000 mg | Freq: Once | INTRAMUSCULAR | Status: AC
  Administered 2022-04-29: 10 mg

## 2022-04-29 NOTE — Patient Instructions (Signed)

## 2022-04-30 DIAGNOSIS — M545 Low back pain, unspecified: Secondary | ICD-10-CM | POA: Diagnosis not present

## 2022-04-30 NOTE — Progress Notes (Signed)
Subjective:   Patient ID: Cassandra Hendricks, female   DOB: 72 y.o.   MRN: [id number]   HPI Patient presents with pain in the left heel of 2 weeks duration.  Patient states worse when walking does not remember any injury   ROS      Objective:  Physical Exam  Neurovascular status intact acute inflammation pain left plantar fascia with fluid buildup around the medial border     Assessment:  Acute plantar fasciitis left inflammation fluid noted     Plan:  H&P reviewed condition went ahead did sterile prep injected the plantar fascia left 3 mg Kenalog 5 mg Xylocaine applied fascial brace fitted properly into the arch to lift the arch up and instructions on usage given and reappoint in the next few weeks  X-rays indicate spur formation no indication stress fracture or advanced arthritis associated with condition.

## 2022-05-07 ENCOUNTER — Other Ambulatory Visit: Payer: Self-pay | Admitting: Podiatry

## 2022-05-07 DIAGNOSIS — M79672 Pain in left foot: Secondary | ICD-10-CM

## 2022-05-07 DIAGNOSIS — M722 Plantar fascial fibromatosis: Secondary | ICD-10-CM

## 2022-05-14 DIAGNOSIS — M545 Low back pain, unspecified: Secondary | ICD-10-CM | POA: Diagnosis not present

## 2022-05-16 ENCOUNTER — Ambulatory Visit (INDEPENDENT_AMBULATORY_CARE_PROVIDER_SITE_OTHER): Payer: 59 | Admitting: Podiatry

## 2022-05-16 DIAGNOSIS — M778 Other enthesopathies, not elsewhere classified: Secondary | ICD-10-CM

## 2022-05-16 DIAGNOSIS — M722 Plantar fascial fibromatosis: Secondary | ICD-10-CM | POA: Diagnosis not present

## 2022-05-16 DIAGNOSIS — M7751 Other enthesopathy of right foot: Secondary | ICD-10-CM | POA: Diagnosis not present

## 2022-05-16 MED ORDER — METHYLPREDNISOLONE 4 MG PO TBPK: ORAL_TABLET | ORAL | 0 refills | Status: DC

## 2022-05-16 MED ORDER — MELOXICAM 15 MG PO TABS: 15.0000 mg | ORAL_TABLET | Freq: Every day | ORAL | 3 refills | Status: DC

## 2022-05-16 NOTE — Progress Notes (Signed)
She presents today chief complaint of continued pain to her left heel.  Also states that she is having pain to the second metatarsophalangeal joint area she points to it.  Saw Dr. Charlsie Merles 2 weeks ago injected her feel any better.  Objective: Pulses normal.  X 3 severe pain palpation at the plantar ball of the left heel.  She has pain on end range of motion of the second metatarsophalangeal joint with mild hammertoe deformity and hallux valgus deformity.  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  Capsulitis with hammertoe deformity second metatarsophalangeal joint of the right foot.  Plan: Injected around the second metatarsophalangeal joint today with 10 mg Kenalog 5 mg Marcaine.  Inject the left heel today 20 mg Kenalog 5 mg Marcaine point maximal tenderness.  Started her on methylprednisolone to be followed by meloxicam.  Discussed appropriate shoe gear and we will follow-up with her at a previously scheduled appointment in about 1 month we will also be looking at her toenails at that time.

## 2022-05-21 DIAGNOSIS — M545 Low back pain, unspecified: Secondary | ICD-10-CM | POA: Diagnosis not present

## 2022-05-28 DIAGNOSIS — M545 Low back pain, unspecified: Secondary | ICD-10-CM | POA: Diagnosis not present

## 2022-06-04 DIAGNOSIS — M545 Low back pain, unspecified: Secondary | ICD-10-CM | POA: Diagnosis not present

## 2022-06-11 ENCOUNTER — Telehealth: Payer: Self-pay | Admitting: Medical

## 2022-06-11 NOTE — Telephone Encounter (Signed)
Pt has a rash and needs to be seen. Pt is being seen tomorrow

## 2022-06-11 NOTE — Telephone Encounter (Signed)
Pt asks if you can please give her a call. 

## 2022-06-12 ENCOUNTER — Ambulatory Visit (INDEPENDENT_AMBULATORY_CARE_PROVIDER_SITE_OTHER): Payer: 59 | Admitting: Medical

## 2022-06-12 VITALS — BP 120/70 | HR 54 | Temp 97.3°F | Wt 166.6 lb

## 2022-06-12 DIAGNOSIS — T50905A Adverse effect of unspecified drugs, medicaments and biological substances, initial encounter: Secondary | ICD-10-CM

## 2022-06-12 MED ORDER — PREDNISONE 20 MG PO TABS: ORAL_TABLET | ORAL | 0 refills | Status: DC

## 2022-06-12 NOTE — Progress Notes (Signed)
Subjective:  Cassandra Hendricks is a 72 y.o. female who presents for Chief Complaint  Patient presents with   Rash    Rash all over body. Doesn't itch but just there. Not painful. Started vitamin K2 a couple weeks ago and that's when she started breaking out but hasn't taken any K2 since . Been on meloxicam for a couple weeks too     Here for rash.  Started within the last several days.  She has been taking a K2 supplement over-the-counter given history of osteopenia.  Rash started shortly after that.  No other new triggers or new medicines or other new factors.  Rash is not itchy.  Rashes spread out on the arms and legs and torso.  Small little bumps here and there with some scabbing and raised bumps.  Has used some Benadryl.  No fever, no new exposures otherwise.   No other aggravating or relieving factors.    No other c/o  Past Medical History:  Diagnosis Date   Chronic neck pain    per med record from [place]   Female bladder prolapse    Herpes labialis    History of prediabetes 11/21/2015   Hemoglobin A1c 5.9% 05/10/2015. Prior to that 6.1% on 12/21/2014   History of thrombocytopenia    HTN (hypertension)    controlled with HCTZ and metoprolol, DASH. EKG 05/2015 NSR, no abnormalities   Mixed hyperlipidemia    per records from 05/2015 in [place]. diet and exercise   Osteopenia determined by x-ray    DEXA 05/30/2014 in [place]    Stress incontinence    Current Outpatient Medications on File Prior to Visit  Medication Sig Dispense Refill   Ascorbic Acid (VITAMIN C PO) Take by mouth.     Calcium Carbonate (CALCIUM 500 PO) Take 1 tablet by mouth daily.     celecoxib (CELEBREX) 100 MG capsule Take 100 mg by mouth daily as needed for moderate pain.     glucosamine-chondroitin 500-400 MG tablet Take 1 tablet by mouth 3 (three) times daily.     hydrochlorothiazide (HYDRODIURIL) 25 MG tablet Take 1 tablet by mouth once daily 90 tablet 0   meloxicam (MOBIC) 15 MG tablet Take 1 tablet (15  mg total) by mouth daily. 30 tablet 3   metoprolol tartrate (LOPRESSOR) 50 MG tablet Take 1 tablet by mouth twice daily 180 tablet 0   Multiple Vitamin (MULTIVITAMIN) tablet Take 1 tablet by mouth daily.     rosuvastatin (CRESTOR) 20 MG tablet Take 1 tablet (20 mg total) by mouth daily. 90 tablet 1   terbinafine (LAMISIL) 250 MG tablet Take 1 tablet (250 mg total) by mouth daily. 90 tablet 0   valACYclovir (VALTREX) 500 MG tablet Take 1 tablet by mouth once daily 90 tablet 0   zinc gluconate 50 MG tablet Take 50 mg by mouth daily.     No current facility-administered medications on file prior to visit.     The following portions of the patient's history were reviewed and updated as appropriate: allergies, current medications, past family history, past medical history, past social history, past surgical history and problem list.  ROS Otherwise as in subjective above  Objective: BP 120/70   Pulse (!) 54   Temp (!) 97.3 F (36.3 C)   Wt 166 lb 9.6 oz (75.6 kg)   BMI 30.47 kg/m   General appearance: alert, no distress, well developed, well nourished Scattered small mostly flat 2 to 3 mm maculopapular lesions on arms and legs  and torso, there is a few lesions of the anterior upper arms with a small raised scab which could be from excoriations but no other raised bumps but this rash Scattered solar keratoses in general and arms and torso   Assessment: Encounter Diagnosis  Name Primary?   Adverse effect of drug, initial encounter Yes     Plan: She will use nondrowsy Benadryl in the morning and back to Benadryl at night for the next couple nights, she is already stopped K2 and the rash is improving already, but if not much better in the next 24 hours can begin a short dose of prednisone as below.  She just finished prednisone a week and a half ago regarding a gout flare, so I really am reluctant to use another round of steroid if we do not have to.  Call or recheck if not much improved  or resolved within the next few days  Cassandra Hendricks was seen today for rash.  Diagnoses and all orders for this visit:  Adverse effect of drug, initial encounter  Other orders -     predniSONE (DELTASONE) 20 MG tablet; 3 tablets today, 2 tablets tomorrow, 1 tablet the third day    Follow up: As needed

## 2022-06-19 ENCOUNTER — Other Ambulatory Visit: Payer: Self-pay | Admitting: Medical

## 2022-06-19 ENCOUNTER — Telehealth: Payer: Self-pay | Admitting: Medical

## 2022-06-19 MED ORDER — TRIAMCINOLONE ACETONIDE 0.1 % EX CREA: 1.0000 | TOPICAL_CREAM | Freq: Two times a day (BID) | CUTANEOUS | 0 refills | Status: DC

## 2022-06-19 MED ORDER — FAMOTIDINE 40 MG PO TABS: 40.0000 mg | ORAL_TABLET | Freq: Every evening | ORAL | 0 refills | Status: DC

## 2022-06-19 MED ORDER — PREDNISONE 10 MG PO TABS: ORAL_TABLET | ORAL | 0 refills | Status: DC

## 2022-06-19 NOTE — Telephone Encounter (Signed)
Pt called again and she says the rash is the same, not better and not worse, only new symptoms she notices is it feels "warm". She says she scheduled an appointment with dermatology but they can't get her in until July 10 th. She says if you can send her in something she would need it sent to the [facility] in [place] address is [address] [place] [id number]

## 2022-06-19 NOTE — Telephone Encounter (Signed)
Pt was notified.  

## 2022-06-19 NOTE — Telephone Encounter (Signed)
Pt called and states that she was seen on 06/12/2022 for rash. She states that the rash is not any better and thinks she needs additional medication. She states that she is headed to [place] first thing in the morning and would need this addressed today. Pt was advised that it is almost lunchtime on a very busy day but I would send the message back. Pt states she uses [facility] on [address] and can be reached at [phone].

## 2022-06-20 ENCOUNTER — Telehealth: Payer: Self-pay

## 2022-06-20 NOTE — Telephone Encounter (Signed)
Pt. Called [place] with a rash you saw her for a rash. She wanted to know if she was still supposed to take benadryl in the morning and night along with these medications.

## 2022-06-25 ENCOUNTER — Ambulatory Visit (INDEPENDENT_AMBULATORY_CARE_PROVIDER_SITE_OTHER): Payer: 59 | Admitting: Podiatry

## 2022-06-25 DIAGNOSIS — L603 Nail dystrophy: Secondary | ICD-10-CM | POA: Diagnosis not present

## 2022-06-25 DIAGNOSIS — Z79899 Other long term (current) drug therapy: Secondary | ICD-10-CM

## 2022-06-25 MED ORDER — TERBINAFINE HCL 250 MG PO TABS: 250.0000 mg | ORAL_TABLET | Freq: Every day | ORAL | 0 refills | Status: DC

## 2022-06-25 NOTE — Progress Notes (Signed)
She presents today for follow-up of her forefoot and her heel stating that she is doing much better she states that her primary doctor took her off her Celebrex as well as her meloxicam because of the rash that she had.  She states that she finished up her terbinafine.  Objective: Vital signs stable alert oriented x 3 pulses are palpable.  There is no erythema Dem salines drainage odor no reproducible pain on palpation of the forefoot or rear foot and she has thick dystrophic clinically mycotic nails.  Assessment onychomycosis.  Plan: Will get her started back on her Lamisil 1 tablet every other day.  I will follow-up with her in 3 months at which time we will check the forefoot of her right hand.

## 2022-06-30 ENCOUNTER — Other Ambulatory Visit: Payer: Self-pay | Admitting: Medical

## 2022-06-30 DIAGNOSIS — I10 Essential (primary) hypertension: Secondary | ICD-10-CM

## 2022-06-30 DIAGNOSIS — B001 Herpesviral vesicular dermatitis: Secondary | ICD-10-CM

## 2022-07-01 ENCOUNTER — Telehealth: Payer: Self-pay | Admitting: Medical

## 2022-07-01 NOTE — Telephone Encounter (Signed)
Pt called and needs to be explained on why she is taking pepcid and crestor? She asks if a nurse can give her a call.

## 2022-07-17 DIAGNOSIS — L270 Generalized skin eruption due to drugs and medicaments taken internally: Secondary | ICD-10-CM | POA: Diagnosis not present

## 2022-07-17 DIAGNOSIS — L821 Other seborrheic keratosis: Secondary | ICD-10-CM | POA: Diagnosis not present

## 2022-07-17 DIAGNOSIS — L308 Other specified dermatitis: Secondary | ICD-10-CM | POA: Diagnosis not present

## 2022-07-18 ENCOUNTER — Telehealth: Payer: Self-pay | Admitting: Medical

## 2022-07-18 NOTE — Telephone Encounter (Signed)
Pt was notified and will let us know if it resolves

## 2022-07-18 NOTE — Telephone Encounter (Signed)
Pt would like a call. She says she seen her dermatologist yesterday about her rash and the dermatologist was supposed to message Vincenza Hews about Marcina stopping her cholesterol medication. She wants to know is this ok with Vincenza Hews. She says she hasn't taken any of her medication since yesterday. She is worried stopping her cholesterol will cause issues.

## 2022-07-22 ENCOUNTER — Ambulatory Visit: Payer: 59 | Admitting: Medical

## 2022-07-30 DIAGNOSIS — M545 Low back pain, unspecified: Secondary | ICD-10-CM | POA: Diagnosis not present

## 2022-08-05 ENCOUNTER — Ambulatory Visit: Payer: 59 | Admitting: Medical

## 2022-08-05 ENCOUNTER — Encounter: Payer: Self-pay | Admitting: Medical

## 2022-08-05 VITALS — BP 120/70 | HR 70 | Wt 165.4 lb

## 2022-08-05 DIAGNOSIS — I700 Atherosclerosis of aorta: Secondary | ICD-10-CM | POA: Diagnosis not present

## 2022-08-05 DIAGNOSIS — E1165 Type 2 diabetes mellitus with hyperglycemia: Secondary | ICD-10-CM | POA: Diagnosis not present

## 2022-08-05 DIAGNOSIS — E782 Mixed hyperlipidemia: Secondary | ICD-10-CM | POA: Diagnosis not present

## 2022-08-05 DIAGNOSIS — R0989 Other specified symptoms and signs involving the circulatory and respiratory systems: Secondary | ICD-10-CM | POA: Diagnosis not present

## 2022-08-05 DIAGNOSIS — R21 Rash and other nonspecific skin eruption: Secondary | ICD-10-CM

## 2022-08-05 DIAGNOSIS — I872 Venous insufficiency (chronic) (peripheral): Secondary | ICD-10-CM | POA: Diagnosis not present

## 2022-08-05 DIAGNOSIS — I10 Essential (primary) hypertension: Secondary | ICD-10-CM

## 2022-08-05 DIAGNOSIS — D696 Thrombocytopenia, unspecified: Secondary | ICD-10-CM

## 2022-08-05 LAB — CBC WITH DIFFERENTIAL/PLATELET
Basophils Absolute: 0.1 10*3/uL (ref 0.0–0.2)
Basos: 1 %
EOS (ABSOLUTE): 0.2 10*3/uL (ref 0.0–0.4)
Eos: 2 %
Hematocrit: 40.3 % (ref 34.0–46.6)
Hemoglobin: 13.9 g/dL (ref 11.1–15.9)
Immature Grans (Abs): 0 10*3/uL (ref 0.0–0.1)
Immature Granulocytes: 0 %
Lymphocytes Absolute: 1.8 10*3/uL (ref 0.7–3.1)
Lymphs: 20 %
MCH: 32.1 pg (ref 26.6–33.0)
MCHC: 34.5 g/dL (ref 31.5–35.7)
MCV: 93 fL (ref 79–97)
Monocytes Absolute: 0.6 10*3/uL (ref 0.1–0.9)
Monocytes: 7 %
Neutrophils Absolute: 6.3 10*3/uL (ref 1.4–7.0)
Neutrophils: 70 %
Platelets: 128 10*3/uL — ABNORMAL LOW (ref 150–450)
RBC: 4.33 x10E6/uL (ref 3.77–5.28)
RDW: 11.9 % (ref 11.7–15.4)
WBC: 8.9 10*3/uL (ref 3.4–10.8)

## 2022-08-05 LAB — POCT GLYCOSYLATED HEMOGLOBIN (HGB A1C): Hemoglobin A1C: 6.4 % — AB (ref 4.0–5.6)

## 2022-08-05 LAB — SEDIMENTATION RATE

## 2022-08-05 MED ORDER — LANCETS MISC. MISC: 1.0000 | Freq: Three times a day (TID) | 0 refills | Status: AC

## 2022-08-05 MED ORDER — LANCET DEVICE MISC: 1.0000 | Freq: Three times a day (TID) | 0 refills | Status: AC

## 2022-08-05 MED ORDER — BLOOD GLUCOSE MONITORING SUPPL DEVI: 1.0000 | Freq: Three times a day (TID) | 0 refills | Status: AC

## 2022-08-05 MED ORDER — BLOOD GLUCOSE TEST VI STRP: 1.0000 | ORAL_STRIP | Freq: Three times a day (TID) | 0 refills | Status: AC

## 2022-08-05 NOTE — Progress Notes (Signed)
Subjective:  Cassandra Hendricks is a 72 y.o. female who presents for Chief Complaint  Patient presents with   Medical Management of Chronic Issues    restarted cholesterol medication a week ago.      Here for chronic issue follow-up.  Hyperlipidemia, atherosclerosis-she just restarted cholesterol medicine a week ago.  Has stopped this recent thinking it was causing a rash.  She has seen dermatology for this as well.  History of diabetes - not currently on medication.   Checks sugars not often.  Just got back from vacation, had some blue crabs and beer.    HTN - compliant with hydrochlorothiazide 25mg  daily, Metoprolol 50mg  BID . Not checking BP.  No chest , dizziness, edema, lightheaded.  Saw dermatology for rash recently.  They stopped all medications temporarily and nothing has made rash disappear.  So she has restarted her BP and cholesterol medication.    No other aggravating or relieving factors.    No other c/o.  Past Medical History:  Diagnosis Date   Chronic neck pain    per med record from [place]   Female bladder prolapse    Herpes labialis    History of prediabetes 11/21/2015   Hemoglobin A1c 5.9% 05/10/2015. Prior to that 6.1% on 12/21/2014   History of thrombocytopenia    HTN (hypertension)    controlled with HCTZ and metoprolol, DASH. EKG 05/2015 NSR, no abnormalities   Mixed hyperlipidemia    per records from 05/2015 in [place]. diet and exercise   Osteopenia determined by x-ray    DEXA 05/30/2014 in [place]    Stress incontinence    Current Outpatient Medications on File Prior to Visit  Medication Sig Dispense Refill   Ascorbic Acid (VITAMIN C PO) Take by mouth.     Calcium Carbonate (CALCIUM 500 PO) Take 1 tablet by mouth daily.     celecoxib (CELEBREX) 100 MG capsule Take 100 mg by mouth daily as needed for moderate pain.     famotidine (PEPCID) 40 MG tablet Take 1 tablet (40 mg total) by mouth every evening. 30 tablet 0   glucosamine-chondroitin 500-400 MG  tablet Take 1 tablet by mouth 3 (three) times daily.     hydrochlorothiazide (HYDRODIURIL) 25 MG tablet Take 1 tablet by mouth once daily 90 tablet 0   metoprolol tartrate (LOPRESSOR) 50 MG tablet Take 1 tablet by mouth twice daily 180 tablet 0   Multiple Vitamin (MULTIVITAMIN) tablet Take 1 tablet by mouth daily.     rosuvastatin (CRESTOR) 20 MG tablet Take 1 tablet (20 mg total) by mouth daily. 90 tablet 1   triamcinolone cream (KENALOG) 0.1 % Apply 1 Application topically 2 (two) times daily. 45 g 0   valACYclovir (VALTREX) 500 MG tablet Take 1 tablet by mouth once daily 90 tablet 0   zinc gluconate 50 MG tablet Take 50 mg by mouth daily.     No current facility-administered medications on file prior to visit.     The following portions of the patient's history were reviewed and updated as appropriate: allergies, current medications, past family history, past medical history, past social history, past surgical history and problem list.  ROS Otherwise as in subjective above     Objective: BP 120/70   Pulse 70   Wt 165 lb 6.4 oz (75 kg)   BMI 30.25 kg/m   BP Readings from Last 3 Encounters:  08/05/22 120/70  06/12/22 120/70  03/14/22 120/70   Wt Readings from Last 3 Encounters:  08/05/22  165 lb 6.4 oz (75 kg)  06/12/22 166 lb 9.6 oz (75.6 kg)  04/16/22 162 lb (73.5 kg)   General appearance: alert, no distress, well developed, well nourished Skin: scattered 2-60mm flat roundish red patches on lower legs in general, nonspecific Heart: RRR, normal S1, S2, no murmurs Lungs: CTA bilaterally, no wheezes, rhonchi, or rales Pulses: 2+ radial pulses, 1+ pedal pulses, normal cap refill Ext: no edema, few spider veins    Assessment: Encounter Diagnoses  Name Primary?   Primary hypertension Yes   Aortic atherosclerosis (HCC)    Type 2 diabetes mellitus with hyperglycemia, unspecified whether long term insulin use (HCC)    Mixed hyperlipidemia    Rash    Low platelet count  (HCC)    Decreased pulse    Venous insufficiency      Plan: Hypertension-at goal, continue hydrochlorothiazide and metoprolol  Hyperlipidemia-just recently started back on statin after a trial off medication due to new rash. The rash did not change so she has restarted her statin  Rash, she recently did a trial off all medications from dermatology.  Rash did not improve.  Question vasculitis or other.  Labs today, consider skin biopsy  Referral to vascular for consult about blood flow in general, arterial and venous of the legs.  Consider ABIs, venous study.  History of low platelets-updated labs today    Minda was seen today for medical management of chronic issues.  Diagnoses and all orders for this visit:  Primary hypertension  Aortic atherosclerosis (HCC)  Type 2 diabetes mellitus with hyperglycemia, unspecified whether long term insulin use (HCC) -     HgB A1c  Mixed hyperlipidemia  Rash -     Sedimentation rate -     CBC with Differential/Platelet  Low platelet count (HCC) -     Sedimentation rate -     CBC with Differential/Platelet  Decreased pulse -     Ambulatory referral to Vascular Surgery  Venous insufficiency -     Ambulatory referral to Vascular Surgery  Other orders -     Blood Glucose Monitoring Suppl DEVI; 1 each by Does not apply route in the morning, at noon, and at bedtime. May substitute to any manufacturer covered by patient's insurance. -     Glucose Blood (BLOOD GLUCOSE TEST STRIPS) STRP; 1 each by In Vitro route in the morning, at noon, and at bedtime. May substitute to any manufacturer covered by patient's insurance. -     Lancet Device MISC; 1 each by Does not apply route in the morning, at noon, and at bedtime. May substitute to any manufacturer covered by patient's insurance. -     Lancets Misc. MISC; 1 each by Does not apply route in the morning, at noon, and at bedtime. May substitute to any manufacturer covered by patient's  insurance.    Follow up: pending labs

## 2022-08-06 DIAGNOSIS — M545 Low back pain, unspecified: Secondary | ICD-10-CM | POA: Diagnosis not present

## 2022-08-06 NOTE — Progress Notes (Signed)
Results sent through MyChart

## 2022-08-08 ENCOUNTER — Telehealth: Payer: Self-pay | Admitting: Medical

## 2022-08-08 DIAGNOSIS — D691 Qualitative platelet defects: Secondary | ICD-10-CM

## 2022-08-08 NOTE — Telephone Encounter (Signed)
Please call pt asking about referrals you were going to do  I can see one for vein and vascular, she said you mentioned something about a scan of her liver

## 2022-08-09 NOTE — Telephone Encounter (Signed)
Pt is agreeable to Abdominal U/S, I have put referral

## 2022-08-20 DIAGNOSIS — M545 Low back pain, unspecified: Secondary | ICD-10-CM | POA: Diagnosis not present

## 2022-08-21 ENCOUNTER — Other Ambulatory Visit: Payer: Self-pay

## 2022-08-21 DIAGNOSIS — R0989 Other specified symptoms and signs involving the circulatory and respiratory systems: Secondary | ICD-10-CM

## 2022-08-21 IMAGING — CR DG HAND COMPLETE 3+V*L*
3 series · 3 of 3 positions shown · non-contrast
Comparison: None.

CLINICAL DATA: Index finger laceration.

EXAM:
LEFT HAND - COMPLETE 3+ VIEW

[x hand pa left]
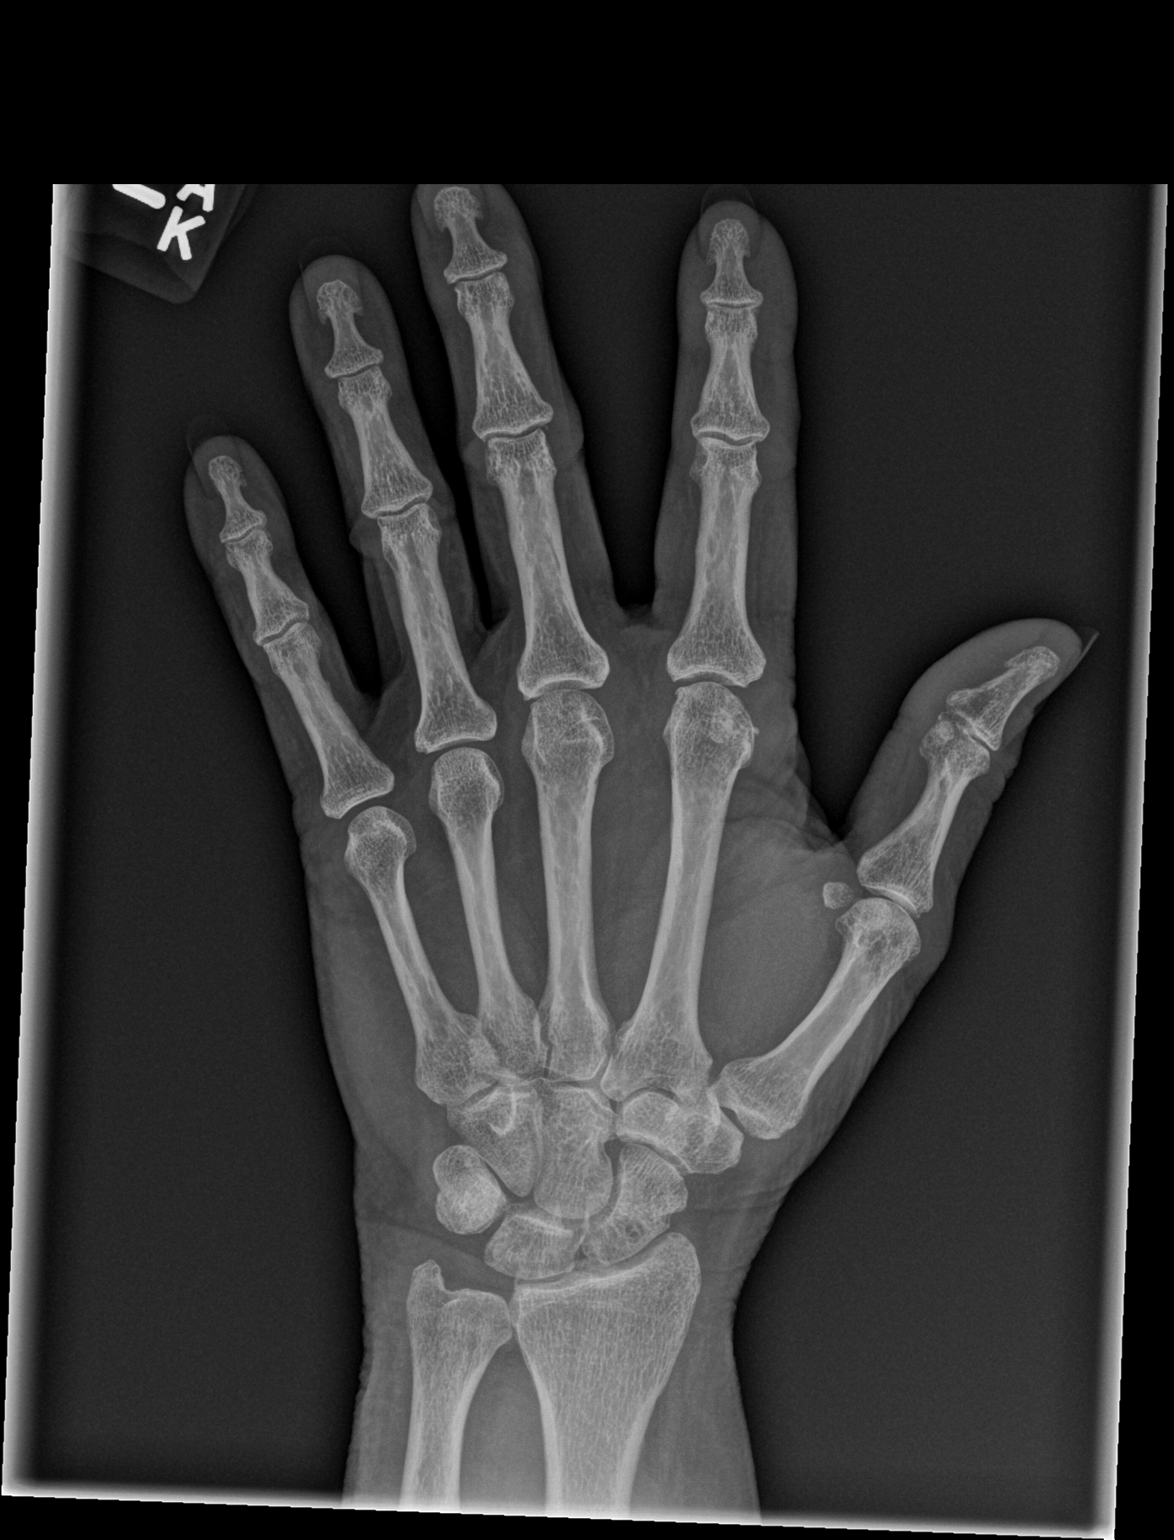

[x hand obl left]
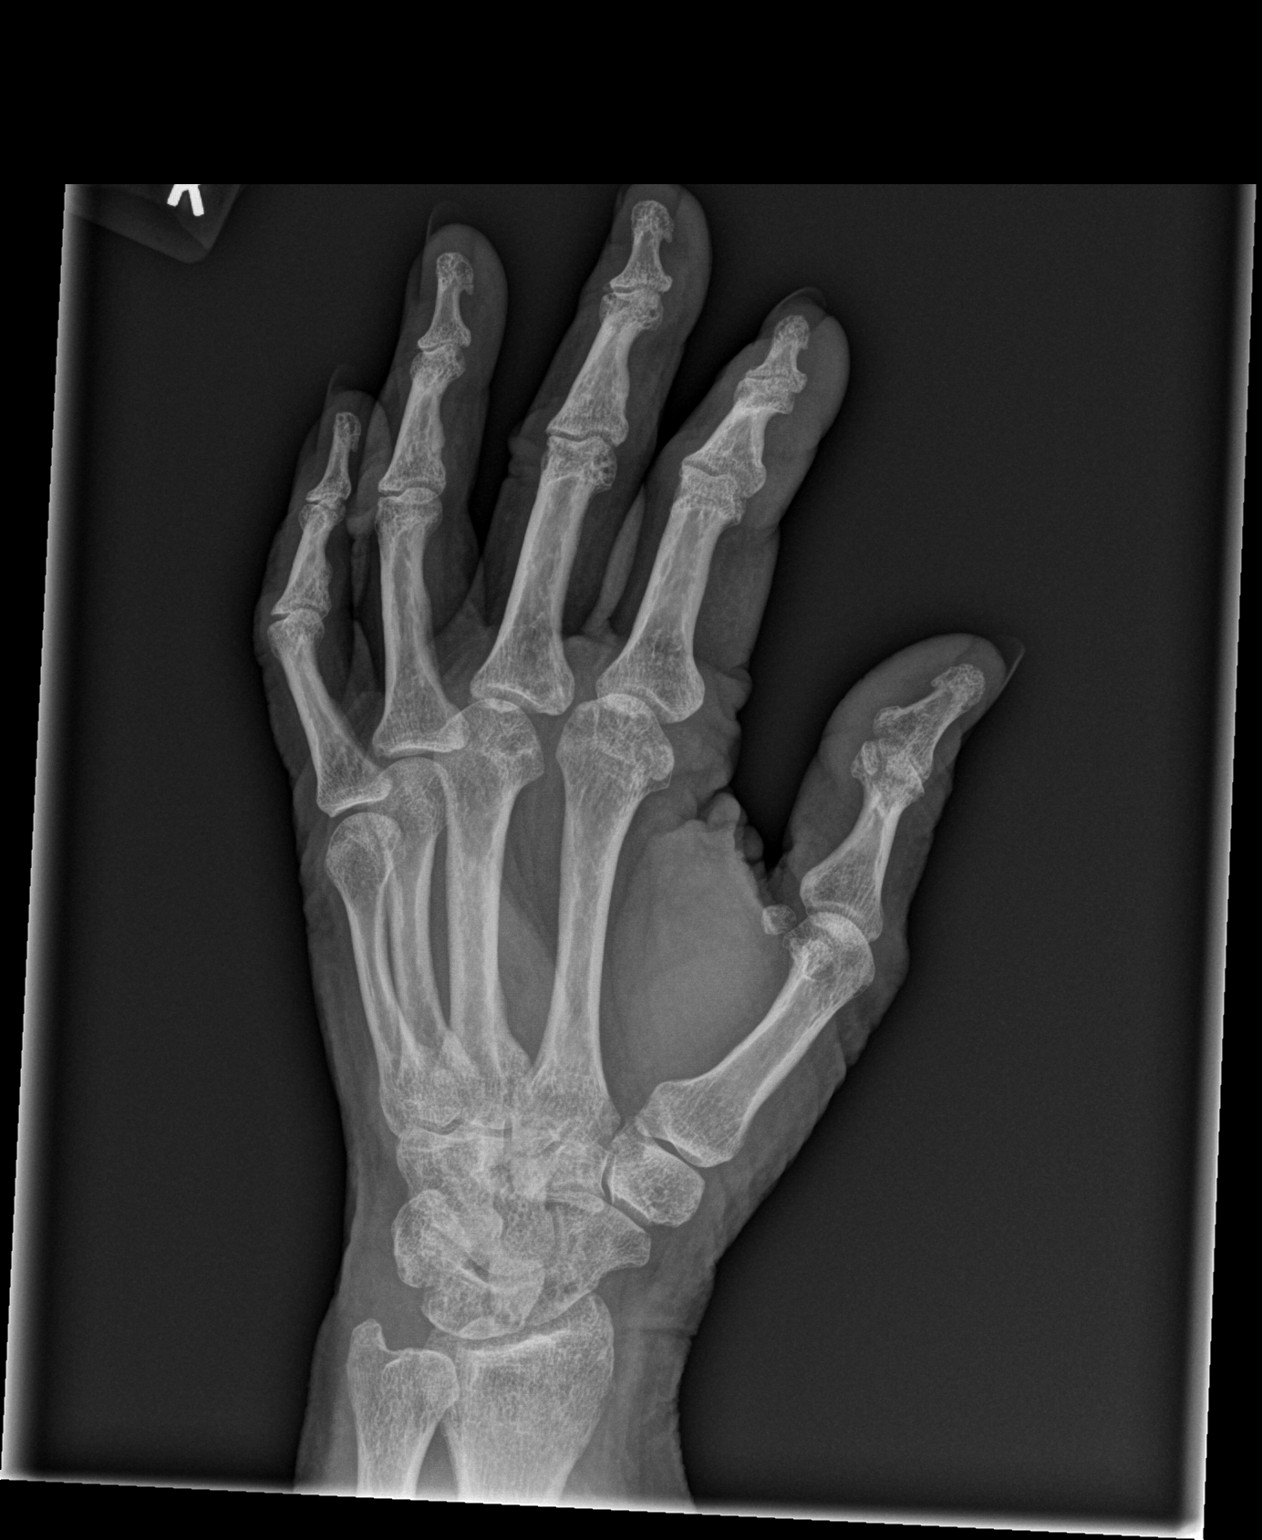

[x hand lat left]
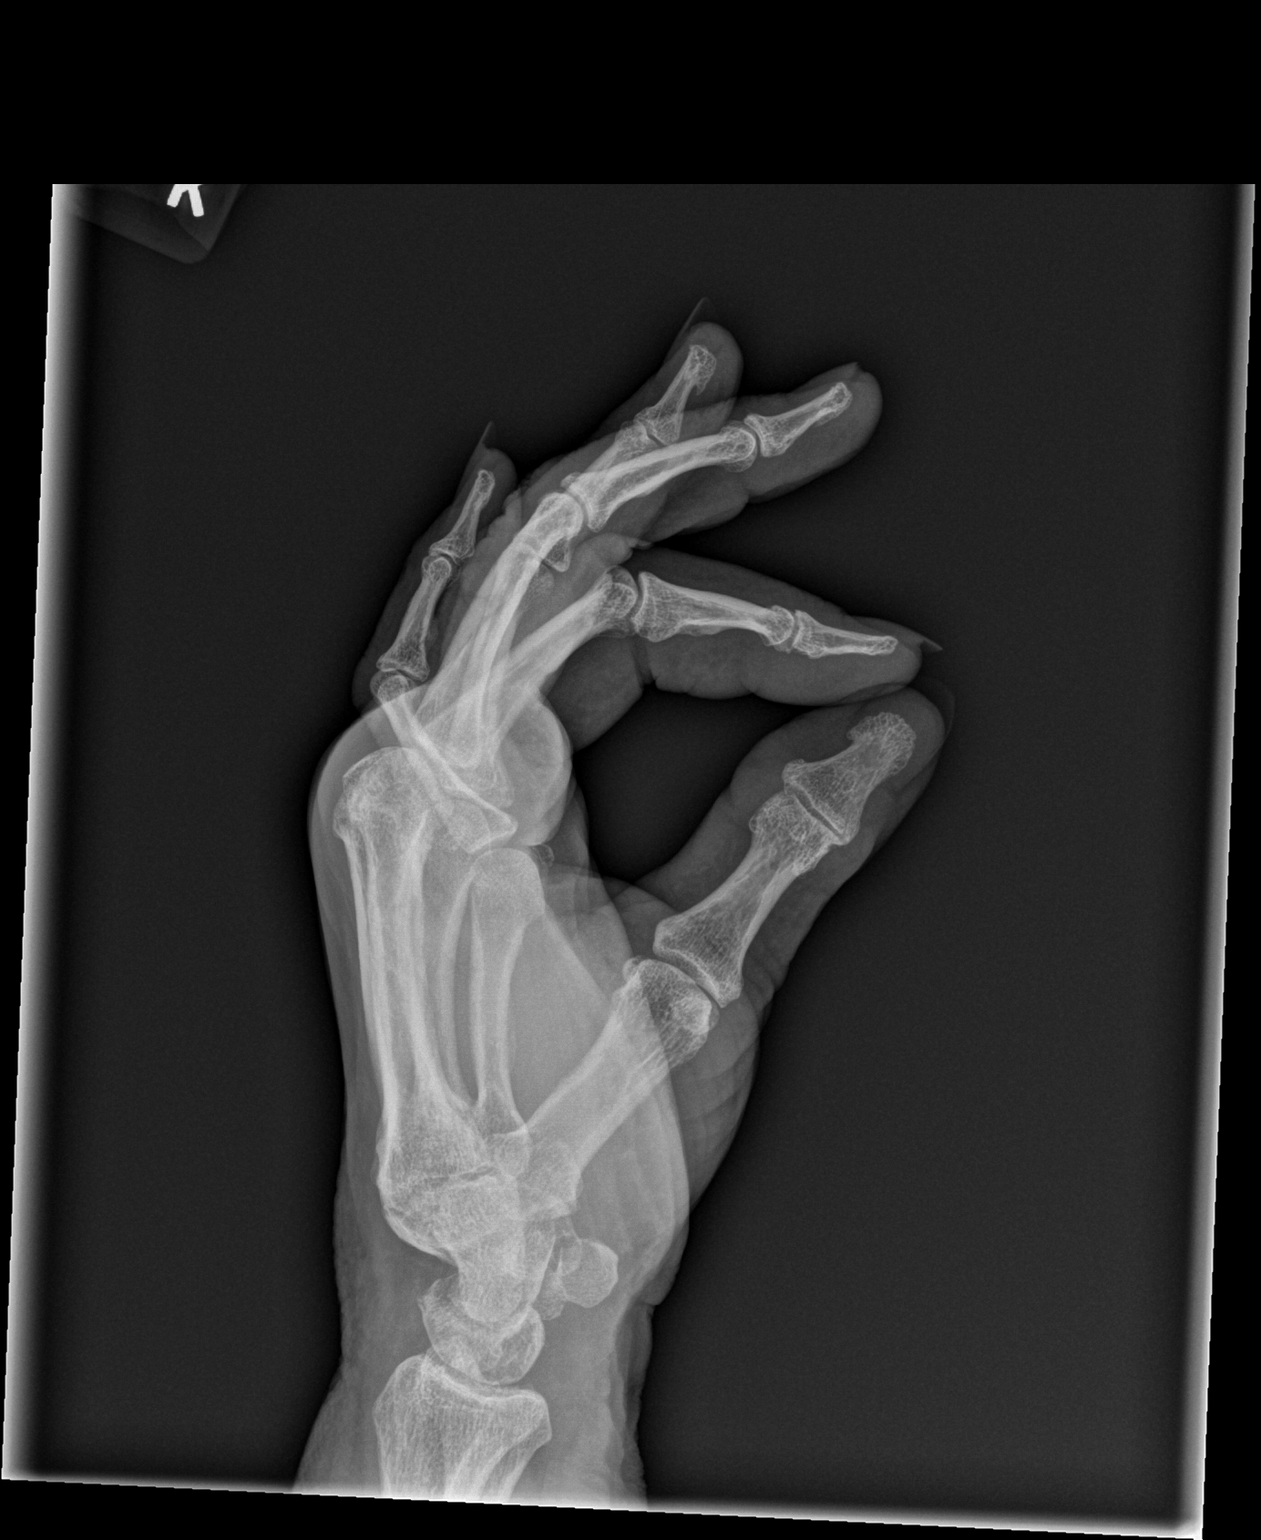

[3 of 3 positions shown; findings below may reference images not displayed]

FINDINGS: No evidence of fracture of the carpal or metacarpal bones.
Radiocarpal joint is intact. Phalanges are normal. No soft tissue
injury. No foreign body.
IMPRESSION: No fracture or foreign body

## 2022-08-26 ENCOUNTER — Ambulatory Visit
Admission: RE | Admit: 2022-08-26 | Discharge: 2022-08-26 | Disposition: A | Discharge status: Home / Self Care | Payer: 59 | Source: Ambulatory Visit | Attending: Medical | Admitting: Medical

## 2022-08-26 DIAGNOSIS — D691 Qualitative platelet defects: Secondary | ICD-10-CM

## 2022-08-26 DIAGNOSIS — K838 Other specified diseases of biliary tract: Secondary | ICD-10-CM | POA: Diagnosis not present

## 2022-08-26 DIAGNOSIS — K769 Liver disease, unspecified: Secondary | ICD-10-CM | POA: Diagnosis not present

## 2022-08-27 DIAGNOSIS — M545 Low back pain, unspecified: Secondary | ICD-10-CM | POA: Diagnosis not present

## 2022-08-27 NOTE — Progress Notes (Signed)
Results sent through MyChart

## 2022-08-28 ENCOUNTER — Ambulatory Visit (HOSPITAL_COMMUNITY)
Admission: RE | Admit: 2022-08-28 | Discharge: 2022-08-28 | Disposition: A | Discharge status: Home / Self Care | Payer: 59 | Source: Ambulatory Visit | Attending: Vascular Surgery | Admitting: Vascular Surgery

## 2022-08-28 DIAGNOSIS — R0989 Other specified symptoms and signs involving the circulatory and respiratory systems: Secondary | ICD-10-CM | POA: Insufficient documentation

## 2022-08-28 LAB — VAS US ABI WITH/WO TBI
Left ABI: 1.35
Right ABI: 1.25

## 2022-09-01 ENCOUNTER — Other Ambulatory Visit: Payer: Self-pay | Admitting: Podiatry

## 2022-09-04 ENCOUNTER — Telehealth: Payer: Self-pay

## 2022-09-04 ENCOUNTER — Other Ambulatory Visit: Payer: Self-pay | Admitting: Podiatry

## 2022-09-04 NOTE — Telephone Encounter (Signed)
Patient called and left a message - she needs to know whether or not she is to continue terbinafine or not - she is leaving for [employer] and either needs a refill or a call back to let her know she is not to take it - please call to advise

## 2022-09-17 DIAGNOSIS — M545 Low back pain, unspecified: Secondary | ICD-10-CM | POA: Diagnosis not present

## 2022-09-20 ENCOUNTER — Telehealth: Payer: Self-pay | Admitting: Medical

## 2022-09-20 NOTE — Telephone Encounter (Signed)
Fax from [facility]  Rosuvastatin 20mg  #30  last filled 09/01/22

## 2022-09-23 MED ORDER — ROSUVASTATIN CALCIUM 20 MG PO TABS: 20.0000 mg | ORAL_TABLET | Freq: Every day | ORAL | 0 refills | Status: DC

## 2022-09-23 NOTE — Telephone Encounter (Signed)
Sent in a 90 days since she has an appt

## 2022-09-24 DIAGNOSIS — M545 Low back pain, unspecified: Secondary | ICD-10-CM | POA: Diagnosis not present

## 2022-09-25 ENCOUNTER — Ambulatory Visit (INDEPENDENT_AMBULATORY_CARE_PROVIDER_SITE_OTHER): Payer: 59 | Admitting: Vascular Surgery

## 2022-09-25 ENCOUNTER — Encounter: Payer: Self-pay | Admitting: Vascular Surgery

## 2022-09-25 VITALS — BP 124/74 | HR 50 | Temp 97.6°F | Resp 20 | Ht 62.0 in | Wt 167.0 lb

## 2022-09-25 DIAGNOSIS — M25562 Pain in left knee: Secondary | ICD-10-CM | POA: Diagnosis not present

## 2022-09-25 DIAGNOSIS — M25561 Pain in right knee: Secondary | ICD-10-CM

## 2022-09-25 DIAGNOSIS — M7989 Other specified soft tissue disorders: Secondary | ICD-10-CM

## 2022-09-25 NOTE — Progress Notes (Signed)
Patient ID: Cassandra Hendricks, female   DOB: 1950-09-16, 72 y.o.   MRN: [id number]  Reason for Consult: New Patient (Initial Visit)   Referred by Jac Canavan, PA-C  Subjective:     HPI:  Cassandra Hendricks is a 72 y.o. female history of bilateral foot pain.  She has worked her entire life on her feet.  She does have some swelling.  She has no previous history of lower extremity interventions.  She does have some small reticular veins that have been present for many years without a history of DVT.  She cannot remember if her parents had any varicosities or venous insufficiency disease.  She walks without limitation.  She denies claudication.  No tissue loss or ulceration.  Past Medical History:  Diagnosis Date   Chronic neck pain    per med record from [place]   Female bladder prolapse    Herpes labialis    History of prediabetes 11/21/2015   Hemoglobin A1c 5.9% 05/10/2015. Prior to that 6.1% on 12/21/2014   History of thrombocytopenia    HTN (hypertension)    controlled with HCTZ and metoprolol, DASH. EKG 05/2015 NSR, no abnormalities   Mixed hyperlipidemia    per records from 05/2015 in [place]. diet and exercise   Osteopenia determined by x-ray    DEXA 05/30/2014 in [place]    Stress incontinence    Family History  Problem Relation Age of Onset   Hypertension Mother    COPD Mother    Thyroid disease Mother    Arthritis-Osteo Mother    Diabetes Father    Hypertension Father    Heart attack Father 14   Hypertension Sister    Diabetes Sister    Cancer Sister        lung ca   Diabetes Sister    Hypertension Sister    Diabetes Brother    Heart failure Brother    Stroke Brother    Thyroid disease Brother    Breast cancer Neg Hx    Past Surgical History:  Procedure Laterality Date   BLADDER REPAIR     2014 in [place]   REPAIR OF RECTAL PROLAPSE     2014    Short Social History:  Social History   Tobacco Use   Smoking status: Former    Current  packs/day: 0.00    Types: Cigarettes    Start date: 10/07/1980    Quit date: 10/07/1992    Years since quitting: 29.9   Smokeless tobacco: Never   Tobacco comments:    light smoker  Substance Use Topics   Alcohol use: No    Allergies  Allergen Reactions   Codeine Nausea And Vomiting   Z-Pak [Azithromycin]     Dizziness, nauseated    Current Outpatient Medications  Medication Sig Dispense Refill   Ascorbic Acid (VITAMIN C PO) Take by mouth.     Blood Glucose Monitoring Suppl DEVI 1 each by Does not apply route in the morning, at noon, and at bedtime. May substitute to any manufacturer covered by patient's insurance. 1 each 0   Calcium Carbonate (CALCIUM 500 PO) Take 1 tablet by mouth daily.     celecoxib (CELEBREX) 100 MG capsule Take 100 mg by mouth daily as needed for moderate pain.     famotidine (PEPCID) 40 MG tablet Take 1 tablet (40 mg total) by mouth every evening. 30 tablet 0   glucosamine-chondroitin 500-400 MG tablet Take 1 tablet by mouth 3 (three) times daily.  hydrochlorothiazide (HYDRODIURIL) 25 MG tablet Take 1 tablet by mouth once daily 90 tablet 0   metoprolol tartrate (LOPRESSOR) 50 MG tablet Take 1 tablet by mouth twice daily 180 tablet 0   Multiple Vitamin (MULTIVITAMIN) tablet Take 1 tablet by mouth daily.     rosuvastatin (CRESTOR) 20 MG tablet Take 1 tablet (20 mg total) by mouth daily. 90 tablet 0   terbinafine (LAMISIL) 250 MG tablet Take 1 tablet by mouth once daily 30 tablet 0   triamcinolone cream (KENALOG) 0.1 % Apply 1 Application topically 2 (two) times daily. 45 g 0   valACYclovir (VALTREX) 500 MG tablet Take 1 tablet by mouth once daily 90 tablet 0   zinc gluconate 50 MG tablet Take 50 mg by mouth daily.     No current facility-administered medications for this visit.    Review of Systems  Constitutional:  Constitutional negative. HENT: HENT negative.  Eyes: Eyes negative.  Cardiovascular: Positive for leg swelling.  GI: Gastrointestinal  negative.  Musculoskeletal: Positive for leg pain.  Skin: Positive for rash.  Neurological: Neurological negative. Hematologic: Hematologic/lymphatic negative.  Psychiatric: Psychiatric negative.        Objective:  Objective   Vitals:   09/25/22 1441  BP: 124/74  Pulse: (!) 50  Resp: 20  Temp: 97.6 F (36.4 C)  SpO2: 95%  Weight: 167 lb (75.8 kg)  Height: 5\' 2"  (1.575 m)   Body mass index is 30.54 kg/m.  Physical Exam HENT:     Mouth/Throat:     Mouth: Mucous membranes are moist.  Eyes:     Pupils: Pupils are equal, round, and reactive to light.  Cardiovascular:     Rate and Rhythm: Normal rate.     Pulses: Normal pulses.  Pulmonary:     Effort: Pulmonary effort is normal.  Abdominal:     General: Abdomen is flat.  Musculoskeletal:     Cervical back: Normal range of motion.     Right lower leg: Edema present.     Left lower leg: Edema present.  Skin:    General: Skin is warm.     Capillary Refill: Capillary refill takes less than 2 seconds.     Comments: Reticular veins bilateral legs  Neurological:     General: No focal deficit present.     Mental Status: She is alert.  Psychiatric:        Mood and Affect: Mood normal.        Thought Content: Thought content normal.        Judgment: Judgment normal.     Data: ABI Findings:  +---------+------------------+-----+---------+--------+  Right   Rt Pressure (mmHg)IndexWaveform Comment   +---------+------------------+-----+---------+--------+  Brachial 144                                       +---------+------------------+-----+---------+--------+  PTA     178               1.24 triphasic          +---------+------------------+-----+---------+--------+  DP      180               1.25 triphasic          +---------+------------------+-----+---------+--------+  Great Toe107               0.74 Normal             +---------+------------------+-----+---------+--------+    +---------+------------------+-----+---------+-------+  Left    Lt Pressure (mmHg)IndexWaveform Comment  +---------+------------------+-----+---------+-------+  Brachial 140                                      +---------+------------------+-----+---------+-------+  PTA     194               1.35 triphasic         +---------+------------------+-----+---------+-------+  DP      177               1.23 triphasic         +---------+------------------+-----+---------+-------+  Great Toe97                0.67 Abnormal          +---------+------------------+-----+---------+-------+   +-------+-----------+-----------+------------+------------+  ABI/TBIToday's ABIToday's TBIPrevious ABIPrevious TBI  +-------+-----------+-----------+------------+------------+  Right 1.25       0.74                                 +-------+-----------+-----------+------------+------------+  Left  1.35       0.67                                 +-------+-----------+-----------+------------+------------+           Summary:  Right: Resting right ankle-brachial index is within normal range. The  right toe-brachial index is normal.   Left: Resting left ankle-brachial index indicates noncompressible left  lower extremity arteries, triphasic waveforms. The left toe-brachial index  is abnormal.       Assessment/Plan:    72 year old female here for evaluation bilateral lower extremity foot pain.  She is barely palpable dorsalis pedis and posterior tibial pulses bilaterally.  She does have mild swelling of her right greater than left lower extremity with some evidence of venous disease with reticular veins and some brown discoloration but I do not think she warrants any evaluation with reflux testing.  As such we will fit her for 15 to 20 mmHg knee-high compression socks and she can see me on an as-needed basis.     Maeola Harman MD [facility]

## 2022-09-26 ENCOUNTER — Other Ambulatory Visit: Payer: Self-pay | Admitting: Medical

## 2022-09-26 ENCOUNTER — Ambulatory Visit (INDEPENDENT_AMBULATORY_CARE_PROVIDER_SITE_OTHER): Payer: 59 | Admitting: Podiatry

## 2022-09-26 ENCOUNTER — Encounter: Payer: Self-pay | Admitting: Podiatry

## 2022-09-26 DIAGNOSIS — M778 Other enthesopathies, not elsewhere classified: Secondary | ICD-10-CM

## 2022-09-26 DIAGNOSIS — M722 Plantar fascial fibromatosis: Secondary | ICD-10-CM

## 2022-09-26 DIAGNOSIS — L603 Nail dystrophy: Secondary | ICD-10-CM

## 2022-09-26 DIAGNOSIS — B001 Herpesviral vesicular dermatitis: Secondary | ICD-10-CM

## 2022-09-26 MED ORDER — TRIAMCINOLONE ACETONIDE 40 MG/ML IJ SUSP
40.0000 mg | Freq: Once | INTRAMUSCULAR | Status: AC
Start: 2022-09-26 — End: 2022-09-26
  Administered 2022-09-26: 40 mg

## 2022-09-29 NOTE — Progress Notes (Signed)
She presents today for follow-up of her Lamisil.  She states that seems to be doing better denies any problems taking the medication.  She states that she is having some pain around the second and third knuckles that she points.  She also has some tenderness on palpation of her left heel.  Objective: Vital signs are stable alert oriented x 3.  Right foot does demonstrate some pain on palpation and end range of motion of the second third metatarsal phalangeal joint with some medial deviation of the toes.  He also has tenderness on palpation no pain on medial-lateral compression of the plantar fascia calcaneal insertion site however her toenails do appear to be healing very nicely much decrease in thickness and discoloration that they still have approximately 50% to grow out most likely slow due to the nail dystrophy.  Assessment: Capsulitis right foot forefoot #2 and #3.  She has plantar fasciitis left foot.  And resolving onychomycosis with long-term therapy of Lamisil.  Plan: Continue Lamisil 1 tablet every other day.  Also injected the second metatarsophalangeal joint with Kenalog in the plantar fascia with Kenalog as well and I will follow-up with her in 3 months unless she needs me sooner.

## 2022-09-30 ENCOUNTER — Other Ambulatory Visit: Payer: Self-pay

## 2022-09-30 ENCOUNTER — Other Ambulatory Visit (INDEPENDENT_AMBULATORY_CARE_PROVIDER_SITE_OTHER): Payer: 59

## 2022-09-30 DIAGNOSIS — Z23 Encounter for immunization: Secondary | ICD-10-CM | POA: Diagnosis not present

## 2022-09-30 MED ORDER — TERBINAFINE HCL 250 MG PO TABS: 250.0000 mg | ORAL_TABLET | Freq: Every day | ORAL | 0 refills | Status: DC

## 2022-10-03 ENCOUNTER — Other Ambulatory Visit: Payer: Self-pay | Admitting: Medical

## 2022-10-03 DIAGNOSIS — Z1231 Encounter for screening mammogram for malignant neoplasm of breast: Secondary | ICD-10-CM

## 2022-10-14 ENCOUNTER — Other Ambulatory Visit: Payer: Self-pay | Admitting: Medical

## 2022-10-14 DIAGNOSIS — I10 Essential (primary) hypertension: Secondary | ICD-10-CM

## 2022-10-15 DIAGNOSIS — M545 Low back pain, unspecified: Secondary | ICD-10-CM | POA: Diagnosis not present

## 2022-10-18 ENCOUNTER — Other Ambulatory Visit (INDEPENDENT_AMBULATORY_CARE_PROVIDER_SITE_OTHER): Payer: 59

## 2022-10-18 DIAGNOSIS — Z23 Encounter for immunization: Secondary | ICD-10-CM

## 2022-10-22 DIAGNOSIS — M545 Low back pain, unspecified: Secondary | ICD-10-CM | POA: Diagnosis not present

## 2022-10-29 DIAGNOSIS — M545 Low back pain, unspecified: Secondary | ICD-10-CM | POA: Diagnosis not present

## 2022-11-05 DIAGNOSIS — M545 Low back pain, unspecified: Secondary | ICD-10-CM | POA: Diagnosis not present

## 2022-11-12 DIAGNOSIS — M545 Low back pain, unspecified: Secondary | ICD-10-CM | POA: Diagnosis not present

## 2022-11-13 ENCOUNTER — Telehealth: Payer: Self-pay | Admitting: Medical

## 2022-11-13 NOTE — Telephone Encounter (Signed)
Pt called about message she received re eye exam  She is scheduled for January 2025  with Dr. Emily Filbert

## 2022-11-13 NOTE — Telephone Encounter (Signed)
Noted  

## 2022-11-19 DIAGNOSIS — M545 Low back pain, unspecified: Secondary | ICD-10-CM | POA: Diagnosis not present

## 2022-11-26 DIAGNOSIS — M545 Low back pain, unspecified: Secondary | ICD-10-CM | POA: Diagnosis not present

## 2022-12-17 DIAGNOSIS — M545 Low back pain, unspecified: Secondary | ICD-10-CM | POA: Diagnosis not present

## 2022-12-19 ENCOUNTER — Ambulatory Visit: Payer: 59 | Admitting: Medical

## 2022-12-19 ENCOUNTER — Telehealth: Payer: Self-pay | Admitting: Medical

## 2022-12-19 ENCOUNTER — Ambulatory Visit (INDEPENDENT_AMBULATORY_CARE_PROVIDER_SITE_OTHER): Payer: 59 | Admitting: Podiatry

## 2022-12-19 VITALS — BP 122/72 | HR 47 | Wt 161.0 lb

## 2022-12-19 DIAGNOSIS — Z7185 Encounter for immunization safety counseling: Secondary | ICD-10-CM

## 2022-12-19 DIAGNOSIS — I10 Essential (primary) hypertension: Secondary | ICD-10-CM | POA: Diagnosis not present

## 2022-12-19 DIAGNOSIS — I700 Atherosclerosis of aorta: Secondary | ICD-10-CM | POA: Diagnosis not present

## 2022-12-19 DIAGNOSIS — E1165 Type 2 diabetes mellitus with hyperglycemia: Secondary | ICD-10-CM | POA: Diagnosis not present

## 2022-12-19 DIAGNOSIS — L603 Nail dystrophy: Secondary | ICD-10-CM

## 2022-12-19 DIAGNOSIS — Z129 Encounter for screening for malignant neoplasm, site unspecified: Secondary | ICD-10-CM | POA: Diagnosis not present

## 2022-12-19 DIAGNOSIS — E782 Mixed hyperlipidemia: Secondary | ICD-10-CM | POA: Diagnosis not present

## 2022-12-19 DIAGNOSIS — Z23 Encounter for immunization: Secondary | ICD-10-CM | POA: Diagnosis not present

## 2022-12-19 DIAGNOSIS — K760 Fatty (change of) liver, not elsewhere classified: Secondary | ICD-10-CM | POA: Diagnosis not present

## 2022-12-19 MED ORDER — TERBINAFINE HCL 250 MG PO TABS: 250.0000 mg | ORAL_TABLET | Freq: Every day | ORAL | 0 refills | Status: DC

## 2022-12-19 MED ORDER — EZETIMIBE 10 MG PO TABS: 10.0000 mg | ORAL_TABLET | Freq: Every day | ORAL | 3 refills | Status: DC

## 2022-12-19 MED ORDER — RYBELSUS 3 MG PO TABS: 3.0000 mg | ORAL_TABLET | Freq: Every day | ORAL | 1 refills | Status: DC

## 2022-12-19 NOTE — Progress Notes (Signed)
She presents today for follow-up of her nail fungus.  States that she seems to be doing pretty well and the capsulitis is doing well as well.  Had no gross taking medication.  Objective: Mostly stable oriented x 3 nails.  We about 50% healed.  She has no pain on palpation of the second metatarsophalangeal joint.  Assessment onychomycosis long-term therapy of Lamisil.  Plan: Will do 1 more dose of every other day Lamisil.  Follow-up with her in 3 months

## 2022-12-19 NOTE — Progress Notes (Signed)
Subjective:  Cassandra Hendricks is a 72 y.o. female who presents for Chief Complaint  Patient presents with   Diabetes    Diabetes- no concerns     Diabetes - checks sugars once weekly , numbers running under 130.  HTN - compliant with medicaiton, hydrochlorothiazide 25mg  daily, metoprolol 50mg  BID  Hyperlipidemia - compliant with Crestor 20mg  daily  On treatment for infected tooth currently  Mammogram coming up 03/2023.   Thinks black or green tea causes rash/breakout on skin.    Sees podiatry  No other aggravating or relieving factors.    No other c/o.  Past Medical History:  Diagnosis Date   Chronic neck pain    per med record from [place]   Female bladder prolapse    Herpes labialis    History of prediabetes 11/21/2015   Hemoglobin A1c 5.9% 05/10/2015. Prior to that 6.1% on 12/21/2014   History of thrombocytopenia    HTN (hypertension)    controlled with HCTZ and metoprolol, DASH. EKG 05/2015 NSR, no abnormalities   Mixed hyperlipidemia    per records from 05/2015 in [place]. diet and exercise   Osteopenia determined by x-ray    DEXA 05/30/2014 in [place]    Stress incontinence    Current Outpatient Medications on File Prior to Visit  Medication Sig Dispense Refill   Accu-Chek Softclix Lancets lancets 3 (three) times daily.     Ascorbic Acid (VITAMIN C PO) Take by mouth.     Blood Glucose Monitoring Suppl (ACCU-CHEK GUIDE ME) w/Device KIT USE AS DIRECTED IN THE MORNING AND AT NOON AND AT BEDTIME     Calcium Carbonate (CALCIUM 500 PO) Take 1 tablet by mouth daily.     celecoxib (CELEBREX) 100 MG capsule Take 100 mg by mouth daily as needed for moderate pain.     famotidine (PEPCID) 40 MG tablet Take 1 tablet (40 mg total) by mouth every evening. 30 tablet 0   glucosamine-chondroitin 500-400 MG tablet Take 1 tablet by mouth 3 (three) times daily.     hydrochlorothiazide (HYDRODIURIL) 25 MG tablet Take 1 tablet by mouth once daily 90 tablet 1   metoprolol tartrate  (LOPRESSOR) 50 MG tablet Take 1 tablet by mouth twice daily 180 tablet 1   Multiple Vitamin (MULTIVITAMIN) tablet Take 1 tablet by mouth daily.     rosuvastatin (CRESTOR) 20 MG tablet Take 1 tablet (20 mg total) by mouth daily. 90 tablet 0   valACYclovir (VALTREX) 500 MG tablet Take 1 tablet by mouth once daily 90 tablet 0   zinc gluconate 50 MG tablet Take 50 mg by mouth daily.     Blood Glucose Monitoring Suppl DEVI 1 each by Does not apply route in the morning, at noon, and at bedtime. May substitute to any manufacturer covered by patient's insurance. 1 each 0   No current facility-administered medications on file prior to visit.    The following portions of the patient's history were reviewed and updated as appropriate: allergies, current medications, past family history, past medical history, past social history, past surgical history and problem list.  ROS Otherwise as in subjective above    Objective: BP 122/72   Pulse (!) 47   Wt 161 lb (73 kg)   BMI 29.45 kg/m   Wt Readings from Last 3 Encounters:  12/19/22 161 lb (73 kg)  09/25/22 167 lb (75.8 kg)  08/05/22 165 lb 6.4 oz (75 kg)   General appearance: alert, no distress, well developed, well nourished Heart: RRR, normal  S1, S2, no murmurs Lungs: CTA bilaterally, no wheezes, rhonchi, or rales Pulses: 2+ radial pulses, 2+ pedal pulses, normal cap refill Ext: no edema  Diabetic Foot Exam - Simple   Simple Foot Form Visual Inspection See comments: Yes Sensation Testing See comments: Yes Pulse Check See comments: Yes Comments 1+ pedal pulses, somewhat decreased monofilament sensation, hypertrophic nails, hammer toe right 2nd toe      Assessment: Encounter Diagnoses  Name Primary?   Type 2 diabetes mellitus with hyperglycemia, unspecified whether long term insulin use (HCC) Yes   Screening for cancer    Primary hypertension    Aortic atherosclerosis (HCC)    Mixed hyperlipidemia    Fatty liver    Vaccine  counseling    Need for pneumococcal 20-valent conjugate vaccination      Plan: Diabetes Begin trial of Rybelsus 3mg  daily for help with diabetes, weight loss and fatty liver disease findings on prior scan 02/2022 CT Continue glucose monitoring Rybelsus side effects can include nausea, indigestion and stool changes  Hypertension Continue Metoprolol 50mg  twice daily Continue Hydrochlorothiazide 25mg  daily   Hyperlipidemia -  On rosuvastatin Crestor 20mg  daily Begin Zetia once daily along with Crestor to lower LDL cholesterol under 70   Screen for cancer Cologuard negative 2023  Mammogram planned for 03/2023   Vaccines: I recommend Prevnar 20  Counseled on the pneumococcal vaccine.  Vaccine information sheet given.  Pneumococcal vaccine Prevnar 20 given after consent obtained.    Cassandra Hendricks was seen today for diabetes.  Diagnoses and all orders for this visit:  Type 2 diabetes mellitus with hyperglycemia, unspecified whether long term insulin use (HCC) -     Cancel: Comprehensive metabolic panel; Future -     Cancel: Hemoglobin A1c; Future -     Cancel: Lipid panel; Future  Screening for cancer  Primary hypertension -     Cancel: Comprehensive metabolic panel; Future  Aortic atherosclerosis (HCC)  Mixed hyperlipidemia -     Cancel: Lipid panel; Future  Fatty liver  Vaccine counseling  Need for pneumococcal 20-valent conjugate vaccination -     Pneumococcal conjugate vaccine 20-valent (Prevnar 20)  Other orders -     ezetimibe (ZETIA) 10 MG tablet; Take 1 tablet (10 mg total) by mouth daily. -     Semaglutide (RYBELSUS) 3 MG TABS; Take 1 tablet (3 mg total) by mouth daily.    Follow up: pending labs

## 2022-12-19 NOTE — Telephone Encounter (Signed)
At check out this patient thought you wanted a 6 week fu but in your notes you said 3 month fu so she wanted me to confirm which one it is?

## 2022-12-20 ENCOUNTER — Encounter: Payer: Self-pay | Admitting: Internal Medicine

## 2022-12-20 LAB — HM DIABETES EYE EXAM

## 2022-12-23 ENCOUNTER — Encounter: Payer: Self-pay | Admitting: Internal Medicine

## 2022-12-24 DIAGNOSIS — M545 Low back pain, unspecified: Secondary | ICD-10-CM | POA: Diagnosis not present

## 2022-12-26 ENCOUNTER — Other Ambulatory Visit: Payer: Self-pay | Admitting: Medical

## 2022-12-26 DIAGNOSIS — B001 Herpesviral vesicular dermatitis: Secondary | ICD-10-CM

## 2022-12-26 NOTE — Telephone Encounter (Signed)
Results received and care team is up to date. Requested media to be linked to abstract order.

## 2022-12-30 ENCOUNTER — Ambulatory Visit: Payer: 59 | Admitting: Medical

## 2023-01-13 IMAGING — MG DIGITAL SCREENING BILAT W/ CAD
4 series · 4 of 4 positions shown · non-contrast
Comparison: Previous exam(s).

CLINICAL DATA: Screening.

EXAM:
DIGITAL SCREENING BILATERAL MAMMOGRAM WITH CAD
TECHNIQUE: Bilateral screening digital craniocaudal and mediolateral oblique
mammograms were obtained. The images were evaluated with
computer-aided detection.

[R CC]
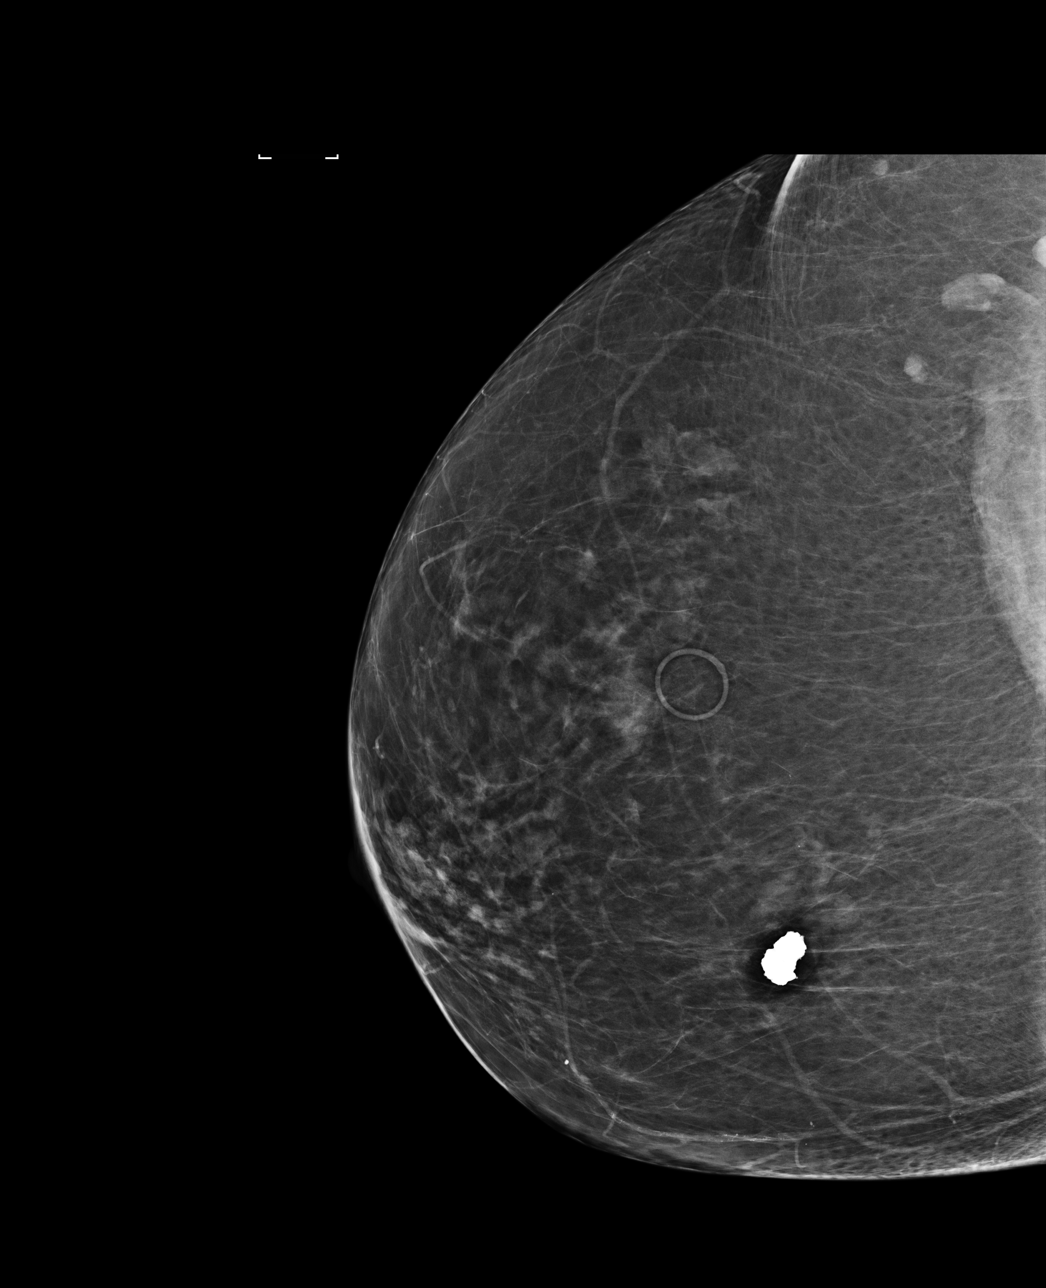

[L MLO]
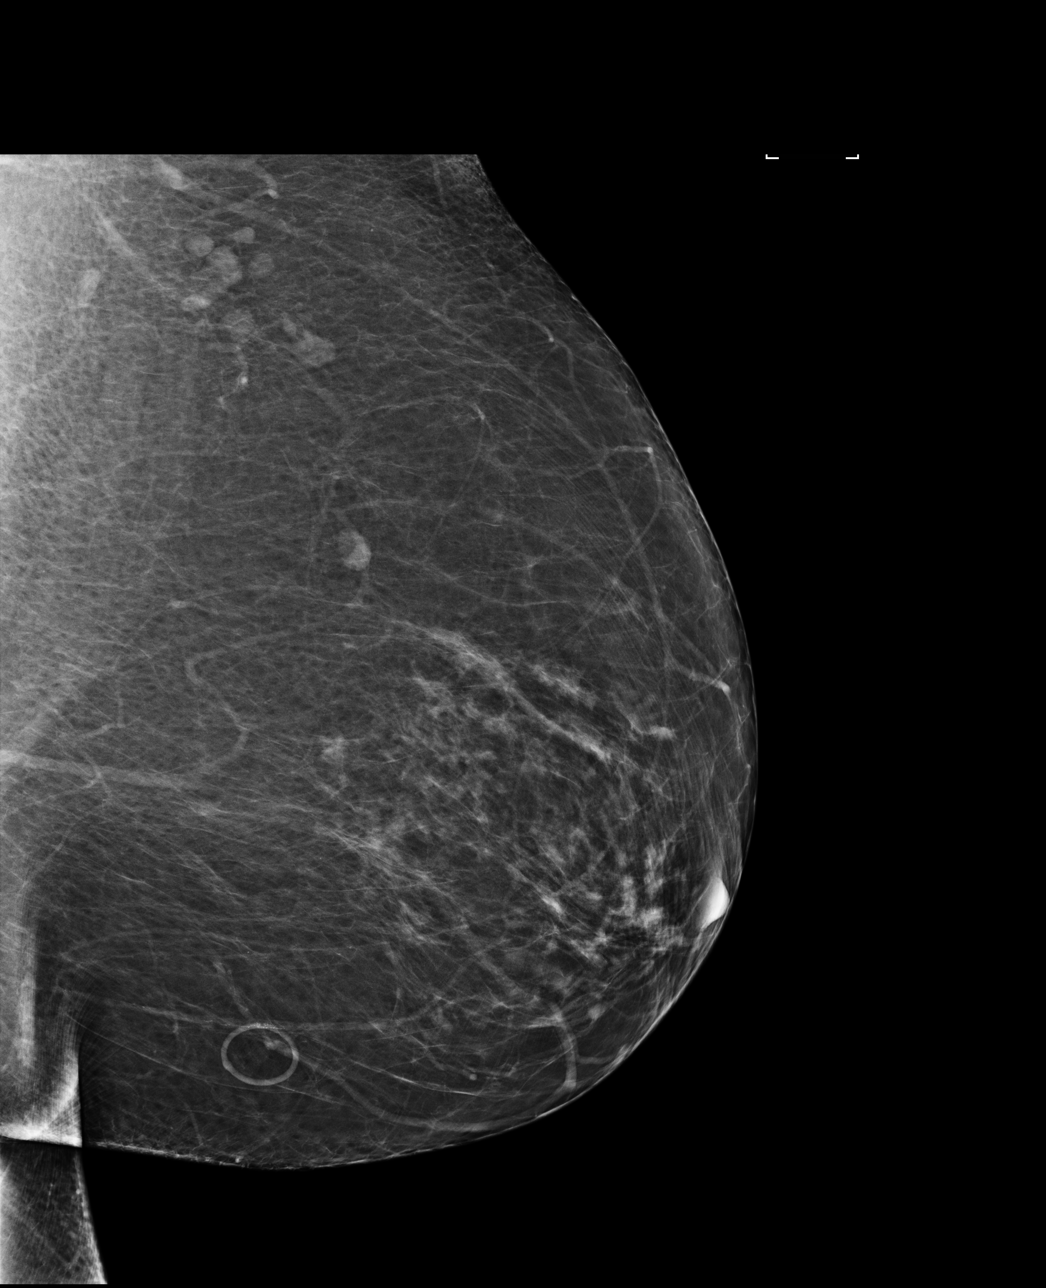

[R MLO]
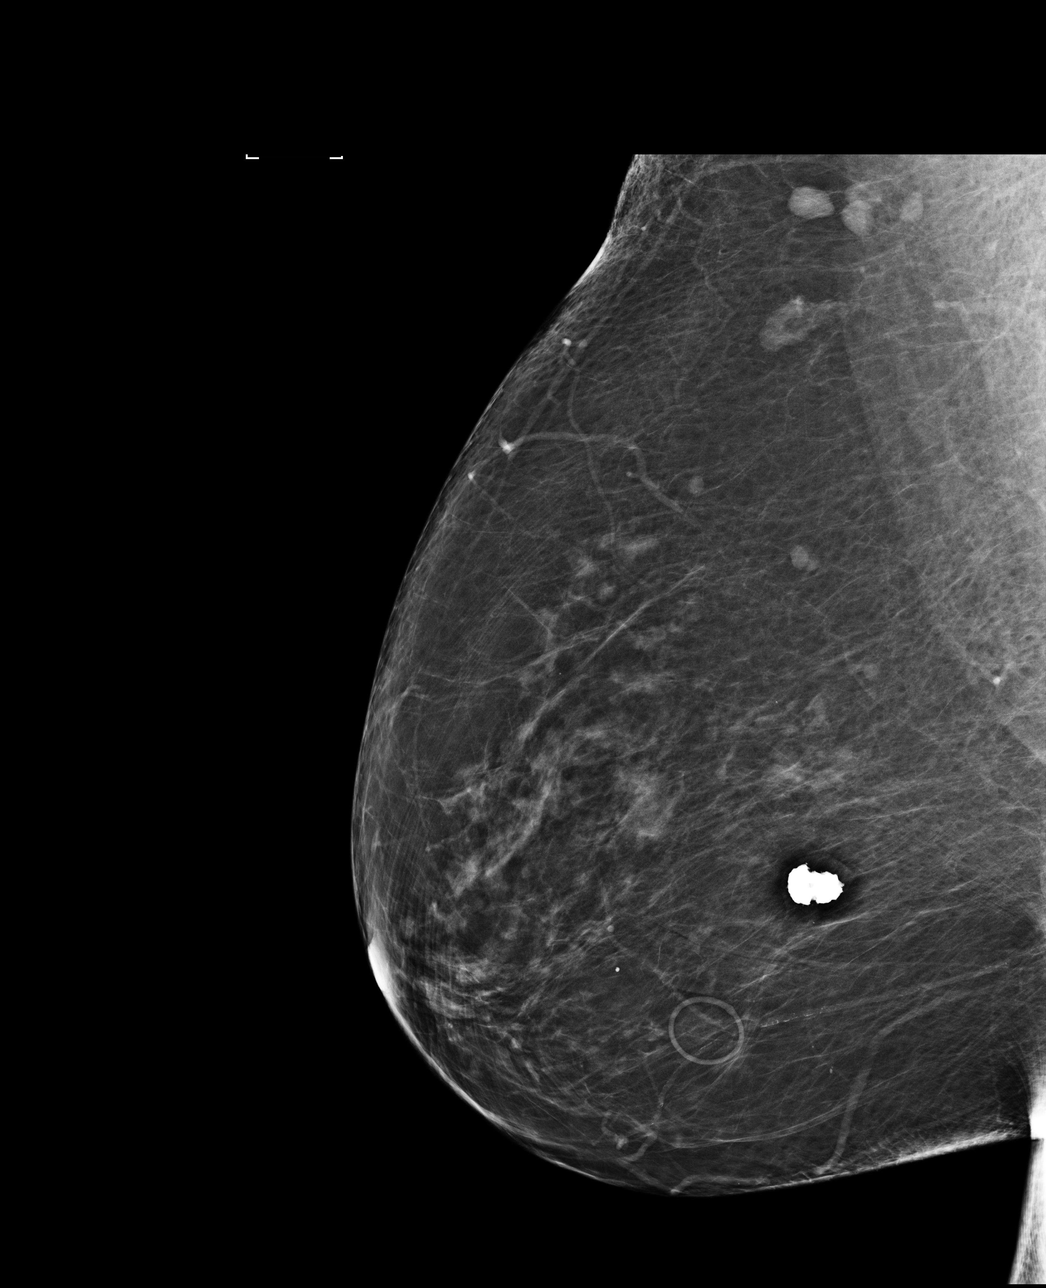

[L CC]
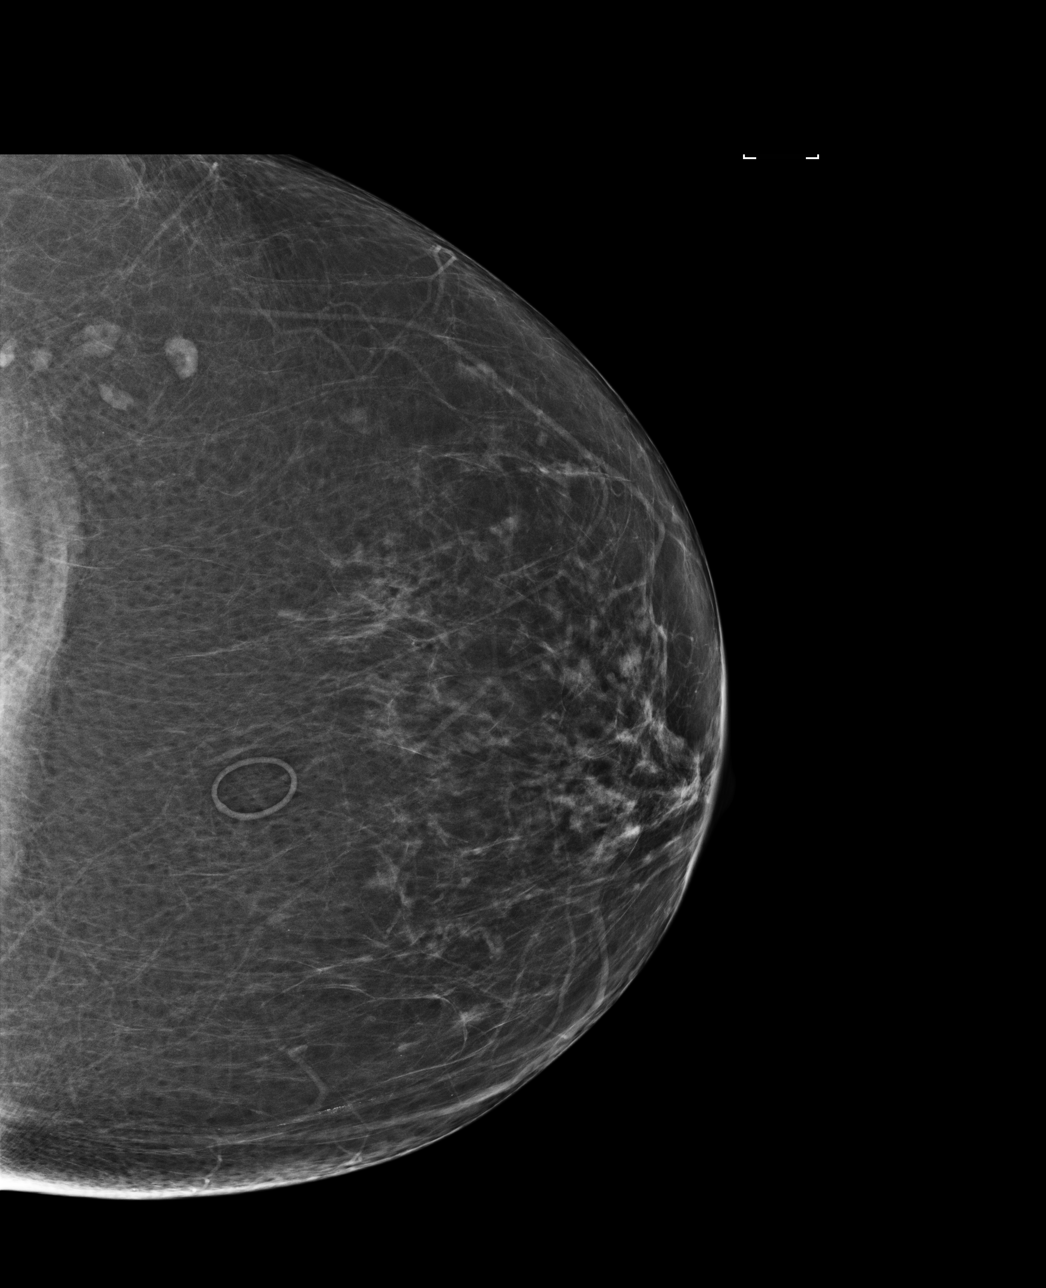

[4 of 4 positions shown; findings below may reference images not displayed]

ACR Breast Density Category b: There are scattered areas of
fibroglandular density.
FINDINGS: There are no findings suspicious for malignancy. The images were
evaluated with computer-aided detection.
IMPRESSION: No mammographic evidence of malignancy. A result letter of this
screening mammogram will be mailed directly to the patient.

RECOMMENDATION:
Screening mammogram in one year. (Code:[id number])

BI-RADS CATEGORY  1: Negative.

## 2023-01-14 DIAGNOSIS — M545 Low back pain, unspecified: Secondary | ICD-10-CM | POA: Diagnosis not present

## 2023-01-28 DIAGNOSIS — M545 Low back pain, unspecified: Secondary | ICD-10-CM | POA: Diagnosis not present

## 2023-02-02 ENCOUNTER — Other Ambulatory Visit: Payer: Self-pay | Admitting: Medical

## 2023-02-03 NOTE — Telephone Encounter (Signed)
Pt has an appt tomorrow. Pt was given 2 month supply on 12/2022 so she should not be out until February

## 2023-02-04 ENCOUNTER — Telehealth (INDEPENDENT_AMBULATORY_CARE_PROVIDER_SITE_OTHER): Payer: 59 | Admitting: Medical

## 2023-02-04 VITALS — Wt 162.0 lb

## 2023-02-04 DIAGNOSIS — R051 Acute cough: Secondary | ICD-10-CM | POA: Diagnosis not present

## 2023-02-04 DIAGNOSIS — J069 Acute upper respiratory infection, unspecified: Secondary | ICD-10-CM | POA: Diagnosis not present

## 2023-02-04 DIAGNOSIS — Z20828 Contact with and (suspected) exposure to other viral communicable diseases: Secondary | ICD-10-CM | POA: Diagnosis not present

## 2023-02-04 DIAGNOSIS — J029 Acute pharyngitis, unspecified: Secondary | ICD-10-CM

## 2023-02-04 NOTE — Progress Notes (Signed)
Subjective:     Patient ID: Cassandra Hendricks, female   DOB: [birth year], 73 y.o.   MRN: [id number]  This visit type was conducted due to national recommendations for restrictions regarding the COVID-19 Pandemic (e.g. social distancing) in an effort to limit this patient's exposure and mitigate transmission in our community.  Due to their co-morbid illnesses, this patient is at least at moderate risk for complications without adequate follow up.  This format is felt to be most appropriate for this patient at this time.    Documentation for virtual audio and video telecommunications through [place] encounter:  The patient was located at home. The provider was located in the office. The patient did consent to this visit and is aware of possible charges through their insurance for this visit.  The other persons participating in this telemedicine service were none. Time spent on call was 20 minutes and in review of previous records 20 minutes total.  This virtual service is not related to other E/M service within previous 7 days.   HPI Chief Complaint  Patient presents with   exposure to RSV    Exposure to RSV by 4 people - symptoms, HA, Sore throat, Ear pain, Nose running, cough, sneezing and phelgm. Symptoms has seem to get worse. Symptoms started Friday   Virtual consult for illness symptoms.  Symptoms started 4 to 5 days ago with sore throat.  That has developed into other symptoms including headache, sore throat, ear pain, runny nose, cough, sneezing, getting up phlegm and has worsened.  Hurts to breath through nose.     No fever, no body aches , but some chills.  No NVD.  Using nothing so far for symptom.  No sob, no wheezing, no dyspnea.   Non smoker.   No hx/o lung disease.    Today throat is some better, but everything else the same.  doesn't feel rotten, but not her self either.  She has had exposure to RSV recently, some adult friends had it last week.   Cassandra Hendricks notes having  RSV vaccine last year.    Did covid test last night and it was negative.    Water intake is not the best.    No other aggravating or relieving factors. No other complaint.   Past Medical History:  Diagnosis Date   Chronic neck pain    per med record from [place]   Female bladder prolapse    Herpes labialis    History of prediabetes 11/21/2015   Hemoglobin A1c 5.9% 05/10/2015. Prior to that 6.1% on 12/21/2014   History of thrombocytopenia    HTN (hypertension)    controlled with HCTZ and metoprolol, DASH. EKG 05/2015 NSR, no abnormalities   Mixed hyperlipidemia    per records from 05/2015 in [place]. diet and exercise   Osteopenia determined by x-ray    DEXA 05/30/2014 in [place]    Stress incontinence    Current Outpatient Medications on File Prior to Visit  Medication Sig Dispense Refill   Ascorbic Acid (VITAMIN C PO) Take by mouth.     Calcium Carbonate (CALCIUM 500 PO) Take 1 tablet by mouth daily.     celecoxib (CELEBREX) 100 MG capsule Take 100 mg by mouth daily as needed for moderate pain.     ezetimibe (ZETIA) 10 MG tablet Take 1 tablet (10 mg total) by mouth daily. 90 tablet 3   famotidine (PEPCID) 40 MG tablet Take 1 tablet (40 mg total) by mouth every evening. 30 tablet 0  glucosamine-chondroitin 500-400 MG tablet Take 1 tablet by mouth 3 (three) times daily.     hydrochlorothiazide (HYDRODIURIL) 25 MG tablet Take 1 tablet by mouth once daily 90 tablet 1   metoprolol tartrate (LOPRESSOR) 50 MG tablet Take 1 tablet by mouth twice daily 180 tablet 1   Multiple Vitamin (MULTIVITAMIN) tablet Take 1 tablet by mouth daily.     rosuvastatin (CRESTOR) 20 MG tablet Take 1 tablet (20 mg total) by mouth daily. 90 tablet 0   Semaglutide (RYBELSUS) 3 MG TABS Take 1 tablet (3 mg total) by mouth daily. 30 tablet 1   terbinafine (LAMISIL) 250 MG tablet Take 1 tablet (250 mg total) by mouth daily. 30 tablet 0   valACYclovir (VALTREX) 500 MG tablet Take 1 tablet by mouth once daily 90  tablet 1   zinc gluconate 50 MG tablet Take 50 mg by mouth daily.     Accu-Chek Softclix Lancets lancets 3 (three) times daily.     Blood Glucose Monitoring Suppl (ACCU-CHEK GUIDE ME) w/Device KIT USE AS DIRECTED IN THE MORNING AND AT NOON AND AT BEDTIME     Blood Glucose Monitoring Suppl DEVI 1 each by Does not apply route in the morning, at noon, and at bedtime. May substitute to any manufacturer covered by patient's insurance. 1 each 0   No current facility-administered medications on file prior to visit.     Review of Systems As in subjective    Objective:   Physical Exam Due to coronavirus pandemic stay at home measures, patient visit was virtual and they were not examined in person.   Wt 162 lb (73.5 kg)   BMI 29.63 kg/m   Gen: wd, wn, nad No audible wheezing or dyspnea      Assessment:     Encounter Diagnoses  Name Primary?   Acute cough Yes   Sore throat    Upper respiratory tract infection, unspecified type    Exposure to respiratory syncytial virus (RSV)        Plan:      Symptoms suggest viral URI and probably not RSV given current symptom state.  Fortunately she had the RSV vaccine last year.  Advise rest, hydration, begin over-the-counter Mucinex DM or Coricidin HBP.  Can use Tylenol for fever not feeling well.  May need to cut back on hydrochlorothiazide a few days to help hydrate up since she is not drinking a lot of water.  If any new symptoms or worse symptoms particularly with dyspnea, wheezing, shortness of breath, worse productive mucus over the next 2 days then call back if not seeing improvement over the next 2 to 3 days.  If much worse particular with breathing over the next few days , then go to the emergency department   [name] was seen today for exposure to rsv.  Diagnoses and all orders for this visit:  Acute cough  Sore throat  Upper respiratory tract infection, unspecified type  Exposure to respiratory syncytial virus  (RSV)    Follow up prn

## 2023-02-10 ENCOUNTER — Other Ambulatory Visit: Payer: Self-pay | Admitting: Medical

## 2023-02-10 ENCOUNTER — Telehealth: Payer: Self-pay | Admitting: Medical

## 2023-02-10 MED ORDER — AMOXICILLIN 875 MG PO TABS: 875.0000 mg | ORAL_TABLET | Freq: Two times a day (BID) | ORAL | 0 refills | Status: DC

## 2023-02-10 NOTE — Telephone Encounter (Signed)
Pt states she is still sick, not better, ears hurting off & on, ears popping some, Taking Coricidin HBP, lots of sneezing, just laying around a lot.  Wants to know what you recommend?

## 2023-02-10 NOTE — Telephone Encounter (Signed)
Pt was still having HA, throat pain, and ear pain, some green mucous. She will start antibiotic and let us know if she is not feeling well later on this week

## 2023-02-11 ENCOUNTER — Other Ambulatory Visit: Payer: Self-pay | Admitting: Medical

## 2023-02-25 DIAGNOSIS — M47816 Spondylosis without myelopathy or radiculopathy, lumbar region: Secondary | ICD-10-CM | POA: Diagnosis not present

## 2023-02-25 DIAGNOSIS — M1611 Unilateral primary osteoarthritis, right hip: Secondary | ICD-10-CM | POA: Diagnosis not present

## 2023-03-18 DIAGNOSIS — M545 Low back pain, unspecified: Secondary | ICD-10-CM | POA: Diagnosis not present

## 2023-03-20 ENCOUNTER — Ambulatory Visit: Payer: 59 | Admitting: Podiatry

## 2023-03-24 ENCOUNTER — Ambulatory Visit (INDEPENDENT_AMBULATORY_CARE_PROVIDER_SITE_OTHER): Payer: 59 | Admitting: Medical

## 2023-03-24 VITALS — BP 120/70 | HR 63 | Wt 158.0 lb

## 2023-03-24 DIAGNOSIS — Z9889 Other specified postprocedural states: Secondary | ICD-10-CM | POA: Diagnosis not present

## 2023-03-24 DIAGNOSIS — M8580 Other specified disorders of bone density and structure, unspecified site: Secondary | ICD-10-CM | POA: Diagnosis not present

## 2023-03-24 DIAGNOSIS — E1165 Type 2 diabetes mellitus with hyperglycemia: Secondary | ICD-10-CM

## 2023-03-24 DIAGNOSIS — H43813 Vitreous degeneration, bilateral: Secondary | ICD-10-CM | POA: Diagnosis not present

## 2023-03-24 DIAGNOSIS — E782 Mixed hyperlipidemia: Secondary | ICD-10-CM | POA: Diagnosis not present

## 2023-03-24 DIAGNOSIS — Z9109 Other allergy status, other than to drugs and biological substances: Secondary | ICD-10-CM | POA: Diagnosis not present

## 2023-03-24 DIAGNOSIS — I700 Atherosclerosis of aorta: Secondary | ICD-10-CM

## 2023-03-24 DIAGNOSIS — T50905A Adverse effect of unspecified drugs, medicaments and biological substances, initial encounter: Secondary | ICD-10-CM

## 2023-03-24 DIAGNOSIS — H25813 Combined forms of age-related cataract, bilateral: Secondary | ICD-10-CM | POA: Diagnosis not present

## 2023-03-24 DIAGNOSIS — I10 Essential (primary) hypertension: Secondary | ICD-10-CM

## 2023-03-24 DIAGNOSIS — D696 Thrombocytopenia, unspecified: Secondary | ICD-10-CM | POA: Diagnosis not present

## 2023-03-24 DIAGNOSIS — R682 Dry mouth, unspecified: Secondary | ICD-10-CM | POA: Diagnosis not present

## 2023-03-24 NOTE — Addendum Note (Signed)
 Addended by: Jac Canavan on: 03/24/2023 08:46 AM   Modules accepted: Orders

## 2023-03-24 NOTE — Progress Notes (Signed)
 Subjective:  Cassandra Hendricks is a 73 y.o. female who presents for Chief Complaint  Patient presents with   Medical Management of Chronic Issues    3 month follow-up.      Here for med check.  Diabetes-she is taking Rybelsus and doing fine as far as blood sugars.  Blood sugar was 103 this morning.  She has lost 4 pounds recently on the recommendation.  However ever since she started this medicine she has really bad dry mouth.  No polyuria, no polydipsia.  Hyperlipidemia-compliant with Crestor 20 mg daily and Zetia 10 mg daily without complaint  Hypertension-compliant with metoprolol 50 mg twice daily and hydrochlorothiazide 25 mg daily  She sees podiatry tomorrow.  She goes to podiatry typically every 3 months to podiatry for hypertrophic nails and hammertoe  She sees Dr. Cleophas Hendricks with [facility].  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  Saw hematology about 4 years ago here and saw hematology in [place] in the past as well  No other aggravating or relieving factors.    No other c/o.  Past Medical History:  Diagnosis Date   Chronic neck pain    per med record from [place]   Female bladder prolapse    Herpes labialis    History of prediabetes 11/21/2015   Hemoglobin A1c 5.9% 05/10/2015. Prior to that 6.1% on 12/21/2014   History of thrombocytopenia    HTN (hypertension)    controlled with HCTZ and metoprolol, DASH. EKG 05/2015 NSR, no abnormalities   Mixed hyperlipidemia    per records from 05/2015 in [place]. diet and exercise   Osteopenia determined by x-ray    DEXA 05/30/2014 in [place]    Stress incontinence    Current Outpatient Medications on File Prior to Visit  Medication Sig Dispense Refill   celecoxib (CELEBREX) 100 MG capsule Take 100 mg by mouth daily as needed for moderate pain.     ezetimibe (ZETIA) 10 MG tablet Take 1 tablet (10 mg total) by mouth daily. 90 tablet 3   famotidine (PEPCID) 40  MG tablet Take 1 tablet (40 mg total) by mouth every evening. 30 tablet 0   glucosamine-chondroitin 500-400 MG tablet Take 1 tablet by mouth 3 (three) times daily.     hydrochlorothiazide (HYDRODIURIL) 25 MG tablet Take 1 tablet by mouth once daily 90 tablet 1   metoprolol tartrate (LOPRESSOR) 50 MG tablet Take 1 tablet by mouth twice daily 180 tablet 1   Multiple Vitamin (MULTIVITAMIN) tablet Take 1 tablet by mouth daily.     rosuvastatin (CRESTOR) 20 MG tablet Take 1 tablet (20 mg total) by mouth daily. 90 tablet 0   Semaglutide (RYBELSUS) 3 MG TABS Take 1 tablet by mouth once daily 30 tablet 1   valACYclovir (VALTREX) 500 MG tablet Take 1 tablet by mouth once daily 90 tablet 1   zinc gluconate 50 MG tablet Take 50 mg by mouth daily.     Accu-Chek Softclix Lancets lancets 3 (three) times daily.     Ascorbic Acid (VITAMIN C PO) Take by mouth.     Blood Glucose Monitoring Suppl (ACCU-CHEK GUIDE ME) w/Device KIT USE AS DIRECTED IN THE MORNING AND AT NOON AND AT BEDTIME     Blood Glucose Monitoring Suppl DEVI 1 each by Does not apply route in the morning, at noon, and at bedtime. May substitute to any manufacturer covered by patient's insurance. 1 each 0  Calcium Carbonate (CALCIUM 500 PO) Take 1 tablet by mouth daily.     No current facility-administered medications on file prior to visit.     The following portions of the patient's history were reviewed and updated as appropriate: allergies, current medications, past family history, past medical history, past social history, past surgical history and problem list.  ROS Otherwise as in subjective above  Objective: BP 120/70   Pulse 63   Wt 158 lb (71.7 kg)   BMI 28.90 kg/m   General appearance: alert, no distress, well developed, well nourished Oral cavity: MMM, no lesions Heart: RRR, normal S1, S2, no murmurs Lungs: CTA bilaterally, no wheezes, rhonchi, or rales Abdomen: +bs, soft, non tender, non distended, no masses, no  hepatomegaly, no splenomegaly Pulses: 2+ radial pulses, 2+ pedal pulses, normal cap refill Ext: no edema  Diabetic Foot Exam - Simple   Simple Foot Form Diabetic Foot exam was performed with the following findings: Yes 03/24/2023  8:39 AM  Visual Inspection See comments: Yes Sensation Testing Intact to touch and monofilament testing bilaterally: Yes Pulse Check See comments: Yes Comments 1+ pedal pulses, hammertoe right second toe, hypertrophic nails throughout, relatively flat feet       Assessment: Encounter Diagnoses  Name Primary?   Type 2 diabetes mellitus with hyperglycemia, unspecified whether long term insulin use (HCC) Yes   Thrombocytopenia (HCC)    Primary hypertension    Mixed hyperlipidemia    Osteopenia determined by x-ray    Aortic atherosclerosis (HCC)    Environmental allergies    Dry mouth    Adverse drug effect, initial encounter      Plan: Diabetes Labs today We will need to d/c Rybelsus as she has not done well on this medication due to very dry mouth.  I discussed that we are using this to help her fatty liver as well as diabetes control and weight loss.  Consider injectable weekly version of GLP-1 or other medicine options  Thrombocytopenia-I reviewed back in 2021 notes from hematology.  She was diagnosed with ITP.  No other specific cause found.  Based on her ultrasound last year of her abdomen she has fatty liver disease which could be a source of low platelets over time.  She is also on long long-term chronic preventative Valtrex which has also been linked to low platelets.  She uses this to help prevent frequent cold sores.  Hyperlipidemia-labs today, continue Crestor 20 mg daily and Zetia 10 mg daily  Hypertension-continue metoprolol 50 mg twice daily and hydrochlorothiazide 25 mg daily.   [name] was seen today for medical management of chronic issues.  Diagnoses and all orders for this visit:  Type 2 diabetes mellitus with  hyperglycemia, unspecified whether long term insulin use (HCC) -     Comprehensive metabolic panel -     Hemoglobin A1c -     Microalbumin/Creatinine Ratio, Urine  Thrombocytopenia (HCC) -     CBC with Differential/Platelet  Primary hypertension -     TSH  Mixed hyperlipidemia -     Lipid panel  Osteopenia determined by x-ray  Aortic atherosclerosis (HCC) -     Lipid panel  Environmental allergies  Dry mouth  Adverse drug effect, initial encounter    Follow up: pending labs

## 2023-03-25 ENCOUNTER — Ambulatory Visit: Payer: 59 | Admitting: Podiatry

## 2023-03-25 DIAGNOSIS — M545 Low back pain, unspecified: Secondary | ICD-10-CM | POA: Diagnosis not present

## 2023-03-26 ENCOUNTER — Other Ambulatory Visit: Payer: Self-pay | Admitting: Medical

## 2023-03-26 DIAGNOSIS — I10 Essential (primary) hypertension: Secondary | ICD-10-CM

## 2023-03-26 LAB — MICROALBUMIN / CREATININE URINE RATIO

## 2023-03-26 MED ORDER — METOPROLOL TARTRATE 50 MG PO TABS: 50.0000 mg | ORAL_TABLET | Freq: Two times a day (BID) | ORAL | 2 refills | Status: DC

## 2023-03-26 MED ORDER — EZETIMIBE 10 MG PO TABS: 10.0000 mg | ORAL_TABLET | Freq: Every day | ORAL | 2 refills | Status: DC

## 2023-03-26 MED ORDER — HYDROCHLOROTHIAZIDE 25 MG PO TABS: 12.5000 mg | ORAL_TABLET | Freq: Every day | ORAL | 1 refills | Status: DC

## 2023-03-26 MED ORDER — ROSUVASTATIN CALCIUM 20 MG PO TABS: 20.0000 mg | ORAL_TABLET | Freq: Every day | ORAL | 2 refills | Status: DC

## 2023-03-26 NOTE — Progress Notes (Signed)
 Results sent through MyChart

## 2023-03-27 LAB — CBC WITH DIFFERENTIAL/PLATELET
Basophils Absolute: 0.1 10*3/uL (ref 0.0–0.2)
Basos: 1 %
EOS (ABSOLUTE): 0.2 10*3/uL (ref 0.0–0.4)
Eos: 3 %
Hematocrit: 43.5 % (ref 34.0–46.6)
Hemoglobin: 14.6 g/dL (ref 11.1–15.9)
Immature Grans (Abs): 0 10*3/uL (ref 0.0–0.1)
Immature Granulocytes: 0 %
Lymphocytes Absolute: 1.6 10*3/uL (ref 0.7–3.1)
Lymphs: 27 %
MCH: 31.4 pg (ref 26.6–33.0)
MCHC: 33.6 g/dL (ref 31.5–35.7)
MCV: 94 fL (ref 79–97)
Monocytes Absolute: 0.4 10*3/uL (ref 0.1–0.9)
Monocytes: 7 %
Neutrophils Absolute: 3.7 10*3/uL (ref 1.4–7.0)
Neutrophils: 62 %
Platelets: 136 10*3/uL — ABNORMAL LOW (ref 150–450)
RBC: 4.65 x10E6/uL (ref 3.77–5.28)
RDW: 12.1 % (ref 11.7–15.4)
WBC: 6 10*3/uL (ref 3.4–10.8)

## 2023-03-27 LAB — HEMOGLOBIN A1C
Est. average glucose Bld gHb Est-mCnc: 120 mg/dL
Hgb A1c MFr Bld: 5.8 % — ABNORMAL HIGH (ref 4.8–5.6)

## 2023-03-27 LAB — LIPID PANEL
Chol/HDL Ratio: 2.1 ratio (ref 0.0–4.4)
Cholesterol, Total: 116 mg/dL (ref 100–199)
HDL: 56 mg/dL (ref >39)
LDL Chol Calc (NIH): 44 mg/dL (ref 0–99)
Triglycerides: 80 mg/dL (ref 0–149)
VLDL Cholesterol Cal: 16 mg/dL (ref 5–40)

## 2023-03-27 LAB — COMPREHENSIVE METABOLIC PANEL
ALT: 30 IU/L (ref 0–32)
AST: 30 IU/L (ref 0–40)
Albumin: 4.5 g/dL (ref 3.8–4.8)
Alkaline Phosphatase: 52 IU/L (ref 44–121)
BUN/Creatinine Ratio: 28 (ref 12–28)
BUN: 15 mg/dL (ref 8–27)
Bilirubin Total: 0.5 mg/dL (ref 0.0–1.2)
CO2: 25 mmol/L (ref 20–29)
Calcium: 10 mg/dL (ref 8.7–10.3)
Chloride: 103 mmol/L (ref 96–106)
Creatinine, Ser: 0.53 mg/dL — ABNORMAL LOW (ref 0.57–1.00)
Globulin, Total: 2 g/dL (ref 1.5–4.5)
Glucose: 115 mg/dL — ABNORMAL HIGH (ref 70–99)
Potassium: 4 mmol/L (ref 3.5–5.2)
Sodium: 142 mmol/L (ref 134–144)
Total Protein: 6.5 g/dL (ref 6.0–8.5)
eGFR: 97 mL/min/{1.73_m2} (ref >59)

## 2023-03-27 LAB — MICROALBUMIN / CREATININE URINE RATIO

## 2023-03-27 LAB — TSH: TSH: 2.26 u[IU]/mL (ref 0.450–4.500)

## 2023-03-28 ENCOUNTER — Other Ambulatory Visit: Payer: Self-pay | Admitting: Medical

## 2023-03-28 MED ORDER — OZEMPIC (0.25 OR 0.5 MG/DOSE) 2 MG/1.5ML ~~LOC~~ SOPN: 0.5000 mg | PEN_INJECTOR | SUBCUTANEOUS | 0 refills | Status: DC

## 2023-03-28 MED ORDER — OZEMPIC (0.25 OR 0.5 MG/DOSE) 2 MG/1.5ML ~~LOC~~ SOPN: 0.2500 mg | PEN_INJECTOR | SUBCUTANEOUS | 0 refills | Status: DC

## 2023-03-28 NOTE — Progress Notes (Signed)
 Go back over my recommendations with her and see if she is agreeable to the recommendations

## 2023-04-01 DIAGNOSIS — M545 Low back pain, unspecified: Secondary | ICD-10-CM | POA: Diagnosis not present

## 2023-04-07 DIAGNOSIS — M545 Low back pain, unspecified: Secondary | ICD-10-CM | POA: Diagnosis not present

## 2023-04-08 DIAGNOSIS — M545 Low back pain, unspecified: Secondary | ICD-10-CM | POA: Diagnosis not present

## 2023-04-09 ENCOUNTER — Telehealth: Payer: Self-pay | Admitting: Medical

## 2023-04-09 NOTE — Telephone Encounter (Signed)
 Copied from CRM [id number]. Topic: General - Other >> Apr 09, 2023  3:46 PM Ja-Kwan M wrote: Reason for CRM: Patient stated that she was only prescribed enough medication for 2 months however the follow up appointment that was scheduled goes out 3 months. Patient requests call back to advise if she should cancel the 06/09/23 appointment and schedule a follow up for May. Call back# [phone]

## 2023-04-09 NOTE — Telephone Encounter (Signed)
 Pt was notified.  Pt is taking 0.25mg  and has 2 weeks left. She will then do 0.5mg  weekly for a month and before she runs out she will call to let us know how she is doing so we can see if we need to increase the dose to 1mg  before she comes in in June

## 2023-04-14 ENCOUNTER — Ambulatory Visit
Admission: RE | Admit: 2023-04-14 | Discharge: 2023-04-14 | Disposition: A | Discharge status: Home / Self Care | Payer: 59 | Source: Ambulatory Visit | Attending: Medical | Admitting: Medical

## 2023-04-14 DIAGNOSIS — Z1231 Encounter for screening mammogram for malignant neoplasm of breast: Secondary | ICD-10-CM

## 2023-04-15 DIAGNOSIS — M545 Low back pain, unspecified: Secondary | ICD-10-CM | POA: Diagnosis not present

## 2023-04-22 ENCOUNTER — Ambulatory Visit: Payer: 59

## 2023-04-22 DIAGNOSIS — M545 Low back pain, unspecified: Secondary | ICD-10-CM | POA: Diagnosis not present

## 2023-04-22 DIAGNOSIS — Z0000 Encounter for general adult medical examination without abnormal findings: Secondary | ICD-10-CM | POA: Diagnosis not present

## 2023-04-22 NOTE — Progress Notes (Signed)
 Subjective:   Cassandra Hendricks is a 73 y.o. who presents for a Medicare Wellness preventive visit.  Visit Complete: Virtual I connected with  Cassandra Hendricks on 04/22/23 by a audio enabled telemedicine application and verified that I am speaking with the correct person using two identifiers.  Patient Location: Home  Provider Location: Office/Clinic  I discussed the limitations of evaluation and management by telemedicine. The patient expressed understanding and agreed to proceed.  Vital Signs: Because this visit was a virtual/telehealth visit, some criteria may be missing or patient reported. Any vitals not documented were not able to be obtained and vitals that have been documented are patient reported.  VideoError- Librarian, academic were attempted between this provider and patient, however failed, due to patient having technical difficulties OR patient did not have access to video capability.  We continued and completed visit with audio only.   Persons Participating in Visit: Patient.  AWV Questionnaire: Yes: Patient Medicare AWV questionnaire was completed by the patient on 04/18/2023; I have confirmed that all information answered by patient is correct and no changes since this date.  Cardiac Risk Factors include: advanced age (>37men, >69 women);diabetes mellitus;dyslipidemia;hypertension     Objective:    Today's Vitals   04/22/23 1406  PainSc: 6    There is no height or weight on file to calculate BMI.     04/22/2023    2:13 PM 04/16/2022    2:08 PM  Advanced Directives  Does Patient Have a Medical Advance Directive? No No    Current Medications (verified) Outpatient Encounter Medications as of 04/22/2023  Medication Sig   Accu-Chek Softclix Lancets lancets 3 (three) times daily.   Ascorbic Acid (VITAMIN C PO) Take by mouth.   Blood Glucose Monitoring Suppl (ACCU-CHEK GUIDE ME) w/Device KIT USE AS DIRECTED IN THE MORNING AND AT NOON  AND AT BEDTIME   Blood Glucose Monitoring Suppl DEVI 1 each by Does not apply route in the morning, at noon, and at bedtime. May substitute to any manufacturer covered by patient's insurance.   Calcium Carbonate (CALCIUM 500 PO) Take 1 tablet by mouth daily.   celecoxib (CELEBREX) 100 MG capsule Take 100 mg by mouth daily as needed for moderate pain.   ezetimibe (ZETIA) 10 MG tablet Take 1 tablet (10 mg total) by mouth daily.   glucosamine-chondroitin 500-400 MG tablet Take 1 tablet by mouth 3 (three) times daily.   hydrochlorothiazide (HYDRODIURIL) 25 MG tablet Take 0.5 tablets (12.5 mg total) by mouth daily.   metoprolol tartrate (LOPRESSOR) 50 MG tablet Take 1 tablet (50 mg total) by mouth 2 (two) times daily.   Multiple Vitamin (MULTIVITAMIN) tablet Take 1 tablet by mouth daily.   rosuvastatin (CRESTOR) 20 MG tablet Take 1 tablet (20 mg total) by mouth daily.   Semaglutide,0.25 or 0.5MG /DOS, (OZEMPIC, 0.25 OR 0.5 MG/DOSE,) 2 MG/1.5ML SOPN Inject 0.25 mg into the skin once a week.   valACYclovir (VALTREX) 500 MG tablet Take 1 tablet by mouth once daily   zinc gluconate 50 MG tablet Take 50 mg by mouth daily.   famotidine (PEPCID) 40 MG tablet Take 1 tablet (40 mg total) by mouth every evening. (Patient not taking: Reported on 04/22/2023)   Semaglutide,0.25 or 0.5MG /DOS, (OZEMPIC, 0.25 OR 0.5 MG/DOSE,) 2 MG/1.5ML SOPN Inject 0.5 mg into the skin once a week. (Patient not taking: Reported on 04/22/2023)   No facility-administered encounter medications on file as of 04/22/2023.    Allergies (verified) Codeine and Z-pak [  azithromycin]   History: Past Medical History:  Diagnosis Date   Chronic neck pain    per med record from [place]   Female bladder prolapse    Herpes labialis    History of prediabetes 11/21/2015   Hemoglobin A1c 5.9% 05/10/2015. Prior to that 6.1% on 12/21/2014   History of thrombocytopenia    HTN (hypertension)    controlled with HCTZ and metoprolol, DASH. EKG 05/2015 NSR,  no abnormalities   Mixed hyperlipidemia    per records from 05/2015 in [place]. diet and exercise   Osteopenia determined by x-ray    DEXA 05/30/2014 in [place]    Stress incontinence    Past Surgical History:  Procedure Laterality Date   BLADDER REPAIR     2014 in [place]   REPAIR OF RECTAL PROLAPSE     2014   Family History  Problem Relation Age of Onset   Hypertension Mother    COPD Mother    Thyroid disease Mother    Arthritis-Osteo Mother    Diabetes Father    Hypertension Father    Heart attack Father 10   Hypertension Sister    Diabetes Sister    Lung cancer Sister    Diabetes Sister    Hypertension Sister    Diabetes Brother    Heart failure Brother    Stroke Brother    Thyroid disease Brother    Breast cancer Neg Hx    Social History   Socioeconomic History   Marital status: Divorced    Spouse name: Not on file   Number of children: 2   Years of education: Not on file   Highest education level: 9th grade  Occupational History   Occupation: retired from Education officer, environmental business  Tobacco Use   Smoking status: Former    Current packs/day: 0.00    Types: Cigarettes    Start date: 10/07/1980    Quit date: 10/07/1992    Years since quitting: 30.5   Smokeless tobacco: Never   Tobacco comments:    light smoker  Vaping Use   Vaping status: Never Used  Substance and Sexual Activity   Alcohol use: No   Drug use: No   Sexual activity: Not Currently  Other Topics Concern   Not on file  Social History Narrative   Not on file   Social Drivers of Health   Financial Resource Strain: Low Risk  (04/18/2023)   Overall Financial Resource Strain (CARDIA)    Difficulty of Paying Living Expenses: Not hard at all  Food Insecurity: No Food Insecurity (04/18/2023)   Hunger Vital Sign    Worried About Running Out of Food in the Last Year: Never true    Ran Out of Food in the Last Year: Never true  Transportation Needs: No Transportation Needs (04/18/2023)   PRAPARE -  Administrator, Civil Service (Medical): No    Lack of Transportation (Non-Medical): No  Physical Activity: Sufficiently Active (04/18/2023)   Exercise Vital Sign    Days of Exercise per Week: 6 days    Minutes of Exercise per Session: 30 min  Stress: No Stress Concern Present (04/18/2023)   [employer] of Occupational Health - Occupational Stress Questionnaire    Feeling of Stress : Not at all  Social Connections: Moderately Isolated (04/18/2023)   Social Connection and Isolation Panel [NHANES]    Frequency of Communication with Friends and Family: More than three times a week    Frequency of Social Gatherings with Friends and Family:  More than three times a week    Attends Religious Services: More than 4 times per year    Active Member of Clubs or Organizations: No    Attends Engineer, structural: Not on file    Marital Status: Divorced    Tobacco Counseling Counseling given: Not Answered Tobacco comments: light smoker    Clinical Intake:  Pre-visit preparation completed: Yes  Pain : 0-10 Pain Score: 6  Pain Type: Chronic pain Pain Location: Back Pain Orientation: Lower Pain Descriptors / Indicators: Aching Pain Onset: More than a month ago Pain Frequency: Constant     Nutritional Risks: None Diabetes: Yes CBG done?: No Did pt. bring in CBG monitor from home?: No  Lab Results  Component Value Date   HGBA1C 5.8 (H) 03/24/2023   HGBA1C 6.4 (A) 08/05/2022   HGBA1C 6.0 (H) 03/14/2022     How often do you need to have someone help you when you read instructions, pamphlets, or other written materials from your doctor or pharmacy?: 1 - Never  Interpreter Needed?: No  Information entered by :: NAllen LPN   Activities of Daily Living     04/18/2023    5:35 PM  In your present state of health, do you have any difficulty performing the following activities:  Hearing? 0  Vision? 0  Difficulty concentrating or making decisions? 0  Walking  or climbing stairs? 0  Dressing or bathing? 0  Doing errands, shopping? 0  Preparing Food and eating ? N  Using the Toilet? N  In the past six months, have you accidently leaked urine? Y  Do you have problems with loss of bowel control? N  Managing your Medications? N  Managing your Finances? N  Housekeeping or managing your Housekeeping? N    Patient Care Team: Tysinger, Christiane Cowing, PA-C as PCP - General (Family Medicine) Clemetine Cypher, DPM as Consulting Physician (Podiatry) [facility] Shermon Divine, MD as Consulting Physician (Family Medicine)  Indicate any recent Medical Services you may have received from other than [facility] providers in the past year (date may be approximate).     Assessment:   This is a routine wellness examination for [place].  Hearing/Vision screen Hearing Screening - Comments:: Denies hearing issues Vision Screening - Comments:: Regular eye exams, [facility]   Goals Addressed             This Visit's Progress    Patient Stated       04/22/2023, wants to get down to 150 pounds       Depression Screen     04/22/2023    2:16 PM 08/05/2022   11:33 AM 10/01/2021    3:26 PM 03/19/2021    1:41 PM 12/07/2020    2:19 PM 12/28/2019    3:08 PM 09/15/2019    3:10 PM  PHQ 2/9 Scores  PHQ - 2 Score 0 0 0 0 0 0 0  PHQ- 9 Score 1          Fall Risk     04/18/2023    5:35 PM 08/05/2022   11:33 AM 04/15/2022    5:03 PM 10/01/2021    3:26 PM 03/19/2021    1:40 PM  Fall Risk   Falls in the past year? 0 0 0 0 0  Number falls in past yr: 0 0 0 0 0  Injury with Fall? 0 0 0 0 0  Risk for fall due to : Medication side effect No  Fall Risks Medication side effect No Fall Risks No Fall Risks  Follow up Falls prevention discussed;Falls evaluation completed Falls evaluation completed Falls prevention discussed;Education provided;Falls evaluation completed Falls evaluation completed Falls evaluation completed    MEDICARE RISK AT HOME:   Medicare Risk at Home Any stairs in or around the home?: (Patient-Rptd) Yes If so, are there any without handrails?: (Patient-Rptd) Yes Home free of loose throw rugs in walkways, pet beds, electrical cords, etc?: (Patient-Rptd) Yes Adequate lighting in your home to reduce risk of falls?: (Patient-Rptd) Yes Life alert?: (Patient-Rptd) No Use of a cane, walker or w/c?: (Patient-Rptd) No Grab bars in the bathroom?: (Patient-Rptd) No Shower chair or bench in shower?: (Patient-Rptd) No Elevated toilet seat or a handicapped toilet?: (Patient-Rptd) No  TIMED UP AND GO:  Was the test performed?  No  Cognitive Function: 6CIT completed        04/22/2023    2:17 PM 04/16/2022    2:11 PM  6CIT Screen  What Year? 0 points 0 points  What month? 3 points 0 points  What time? 0 points 0 points  Count back from 20 0 points 0 points  Months in reverse 0 points 0 points  Repeat phrase 0 points 0 points  Total Score 3 points 0 points    Immunizations Immunization History  Administered Date(s) Administered   Fluad Quad(high Dose 65+) 09/23/2018, 09/15/2019, 10/16/2020, 10/01/2021   Fluad Trivalent(High Dose 65+) 09/30/2022   Influenza Split 10/24/2015   Influenza, High Dose Seasonal PF 11/02/2014, 10/14/2017   Influenza,inj,Quad PF,6+ Mos 10/04/2016   Influenza-Unspecified 10/08/2014   PFIZER(Purple Top)SARS-COV-2 Vaccination 03/07/2019, 03/31/2019, 11/19/2019   PNEUMOCOCCAL CONJUGATE-20 12/19/2022   Pfizer Covid-19 Vaccine Bivalent Booster 67yrs & up 12/07/2020   Pfizer(Comirnaty)Fall Seasonal Vaccine 12 years and older 03/14/2022, 10/18/2022   Pneumococcal Conjugate-13 07/01/2017   Pneumococcal-Unspecified 12/08/2014   RSV,unspecified 10/19/2021   Tdap 05/11/2016   Zoster Recombinant(Shingrix) 07/02/2017, 10/14/2017   Zoster, Live 05/08/2015    Screening Tests Health Maintenance  Topic Date Due   COVID-19 Vaccine (7 - Pfizer risk 2024-25 season) 04/18/2023   INFLUENZA VACCINE   08/08/2023   HEMOGLOBIN A1C  09/24/2023   OPHTHALMOLOGY EXAM  12/20/2023   Diabetic kidney evaluation - eGFR measurement  03/23/2024   Diabetic kidney evaluation - Urine ACR  03/23/2024   FOOT EXAM  03/23/2024   MAMMOGRAM  04/14/2024   Medicare Annual Wellness (AWV)  04/21/2024   Fecal DNA (Cologuard)  10/18/2024   DTaP/Tdap/Td (2 - Td or Tdap) 05/12/2026   Pneumonia Vaccine 64+ Years old  Completed   DEXA SCAN  Completed   Hepatitis C Screening  Completed   Zoster Vaccines- Shingrix  Completed   HPV VACCINES  Aged Out   Meningococcal B Vaccine  Aged Out    Health Maintenance  Health Maintenance Due  Topic Date Due   COVID-19 Vaccine (7 - Pfizer risk 2024-25 season) 04/18/2023   Health Maintenance Items Addressed: Mammogram scheduled for 04/28/2023  Additional Screening:  Vision Screening: Recommended annual ophthalmology exams for early detection of glaucoma and other disorders of the eye.  Dental Screening: Recommended annual dental exams for proper oral hygiene  Community Resource Referral / Chronic Care Management: CRR required this visit?  No   CCM required this visit?  No     Plan:     I have personally reviewed and noted the following in the patient's chart:   Medical and social history Use of alcohol, tobacco or illicit drugs  Current medications and  supplements including opioid prescriptions. Patient is not currently taking opioid prescriptions. Functional ability and status Nutritional status Physical activity Advanced directives List of other physicians Hospitalizations, surgeries, and ER visits in previous 12 months Vitals Screenings to include cognitive, depression, and falls Referrals and appointments  In addition, I have reviewed and discussed with patient certain preventive protocols, quality metrics, and best practice recommendations. A written personalized care plan for preventive services as well as general preventive health recommendations  were provided to patient.     Areatha Beecham, LPN   1/61/0960   After Visit Summary: (MyChart) Due to this being a telephonic visit, the after visit summary with patients personalized plan was offered to patient via MyChart   Notes: Nothing significant to report at this time.

## 2023-04-22 NOTE — Patient Instructions (Signed)
 Ms. Sunga , Thank you for taking time to come for your Medicare Wellness Visit. I appreciate your ongoing commitment to your health goals. Please review the following plan we discussed and let me know if I can assist you in the future.   Referrals/Orders/Follow-Ups/Clinician Recommendations: none  This is a list of the screening recommended for you and due dates:  Health Maintenance  Topic Date Due   COVID-19 Vaccine (7 - Pfizer risk 2024-25 season) 04/18/2023   Flu Shot  08/08/2023   Hemoglobin A1C  09/24/2023   Eye exam for diabetics  12/20/2023   Yearly kidney function blood test for diabetes  03/23/2024   Yearly kidney health urinalysis for diabetes  03/23/2024   Complete foot exam   03/23/2024   Mammogram  04/14/2024   Medicare Annual Wellness Visit  04/21/2024   Cologuard (Stool DNA test)  10/18/2024   DTaP/Tdap/Td vaccine (2 - Td or Tdap) 05/12/2026   Pneumonia Vaccine  Completed   DEXA scan (bone density measurement)  Completed   Hepatitis C Screening  Completed   Zoster (Shingles) Vaccine  Completed   HPV Vaccine  Aged Out   Meningitis B Vaccine  Aged Out    Advanced directives: (Copy Requested) Please bring a copy of your health care power of attorney and living will to the office to be added to your chart at your convenience. You can mail to [facility] [address] [place], [place] [address] or email to [email or web address]  Next Medicare Annual Wellness Visit scheduled for next year: Yes  insert Preventive Care attachment Insert FALL PREVENTION attachment if needed

## 2023-04-28 ENCOUNTER — Ambulatory Visit
Admission: RE | Admit: 2023-04-28 | Discharge: 2023-04-28 | Disposition: A | Discharge status: Home / Self Care | Source: Ambulatory Visit | Attending: Medical | Admitting: Medical

## 2023-04-28 DIAGNOSIS — Z1231 Encounter for screening mammogram for malignant neoplasm of breast: Secondary | ICD-10-CM | POA: Diagnosis not present

## 2023-04-29 ENCOUNTER — Ambulatory Visit (INDEPENDENT_AMBULATORY_CARE_PROVIDER_SITE_OTHER): Admitting: Podiatry

## 2023-04-29 ENCOUNTER — Encounter: Payer: Self-pay | Admitting: Podiatry

## 2023-04-29 DIAGNOSIS — L603 Nail dystrophy: Secondary | ICD-10-CM | POA: Diagnosis not present

## 2023-04-29 DIAGNOSIS — M7751 Other enthesopathy of right foot: Secondary | ICD-10-CM | POA: Diagnosis not present

## 2023-04-29 MED ORDER — TERBINAFINE HCL 250 MG PO TABS: 250.0000 mg | ORAL_TABLET | Freq: Every day | ORAL | 0 refills | Status: AC

## 2023-04-29 MED ORDER — TRIAMCINOLONE ACETONIDE 40 MG/ML IJ SUSP
20.0000 mg | Freq: Once | INTRAMUSCULAR | Status: AC
  Administered 2023-04-29: 20 mg

## 2023-04-30 NOTE — Progress Notes (Addendum)
 She presents today for follow-up of nail fungus she is completed her third every other day dosing regimen.  She states that is really not looking that much better.  She said the big toenail on the left foot fell off.  She is also complaining of pain to the second metatarsal phalangeal joint of the right foot.  Objective: Vital signs are stable she is alert and oriented x 3.  Pulses are palpable.  Hallux left nail is growing out nicely just the distal margin of it fell off her right foot is primarily the foot that we are concerned about Dem does demonstrate about 75% outgrowth at this point.  Pain on palpation second metatarsal phalangeal joint of the right foot.  With pain on end range of motion.  Mild hammertoe deformity noted there.  Assessment: Slowly resolving onychomycosis with long-term therapy.  Capsulitis of the second metatarsophalangeal joint with hammertoe deformity  Plan: Continue another dose of 30 tablets 1 tablet every other day follow-up with her in 3 months.  I injected 4 mg of dexamethasone  and local anesthetic into the second interdigital space after sterile Betadine skin prep.

## 2023-05-13 DIAGNOSIS — M545 Low back pain, unspecified: Secondary | ICD-10-CM | POA: Diagnosis not present

## 2023-05-20 DIAGNOSIS — M545 Low back pain, unspecified: Secondary | ICD-10-CM | POA: Diagnosis not present

## 2023-05-26 NOTE — Telephone Encounter (Unsigned)
 Copied from CRM [id number]. Topic: Clinical - Medication Refill >> May 26, 2023  9:31 AM [place] M wrote: Medication: Semaglutide ,0.25 or 0.5MG /DOS, (OZEMPIC , 0.25 OR 0.5 MG/DOSE,) 2 MG/1.5ML SOPN  Has the patient contacted their pharmacy? Yes (Agent: If no, request that the patient contact the pharmacy for the refill. If patient does not wish to contact the pharmacy document the reason why and proceed with request.) (Agent: If yes, when and what did the pharmacy advise?)  This is the patient's preferred pharmacy:  [id number] - [place] - [address] [place]  [address] Phone: [phone] Fax: [phone]  Is this the correct pharmacy for this prescription? Yes If no, delete pharmacy and type the correct one.   Has the prescription been filled recently? Yes  Is the patient out of the medication? Yes  Has the patient been seen for an appointment in the last year OR does the patient have an upcoming appointment? Yes  Can we respond through MyChart? Yes  Agent: Please be advised that Rx refills may take up to 3 business days. We ask that you follow-up with your pharmacy.

## 2023-05-27 ENCOUNTER — Other Ambulatory Visit: Payer: Self-pay | Admitting: Medical

## 2023-05-27 ENCOUNTER — Other Ambulatory Visit: Payer: Self-pay

## 2023-05-27 DIAGNOSIS — M545 Low back pain, unspecified: Secondary | ICD-10-CM | POA: Diagnosis not present

## 2023-05-27 MED ORDER — SEMAGLUTIDE (1 MG/DOSE) 4 MG/3ML ~~LOC~~ SOPN: 1.0000 mg | PEN_INJECTOR | SUBCUTANEOUS | 0 refills | Status: DC

## 2023-05-27 NOTE — Telephone Encounter (Signed)
 Copied from CRM [id number]. Topic: Clinical - Medication Refill >> May 27, 2023  2:51 PM Lesta Rater S wrote: Medication: Semaglutide ,0.25 or 0.5MG /DOS, (OZEMPIC , 0.25 OR 0.5 MG/DOSE,) 2 MG/1.5ML SOPN  Has the patient contacted their pharmacy? Yes (Agent: If no, request that the patient contact the pharmacy for the refill. If patient does not wish to contact the pharmacy document the reason why and proceed with request.) (Agent: If yes, when and what did the pharmacy advise?)  This is the patient's preferred pharmacy:  [id number] - [place] ([place]), [place] - [address] [place] ([place]) [place] [address] Phone: [phone] Fax: [phone]  [facility] [address], [place] - [address] [place] [address] Phone: [phone] Fax: [phone]  [facility] [address] [place] - [address] [place] [address] Phone: [phone] Fax: [phone]  Is this the correct pharmacy for this prescription? Yes If no, delete pharmacy and type the correct one.   Has the prescription been filled recently? Yes  Is the patient out of the medication? Yes  Has the patient been seen for an appointment in the last year OR does the patient have an upcoming appointment? Yes  Can we respond through MyChart? Yes  Agent: Please be advised that Rx refills may take up to 3 business days. We ask that you follow-up with your pharmacy.

## 2023-06-03 DIAGNOSIS — M545 Low back pain, unspecified: Secondary | ICD-10-CM | POA: Diagnosis not present

## 2023-06-09 ENCOUNTER — Ambulatory Visit: Admitting: Medical

## 2023-06-09 ENCOUNTER — Ambulatory Visit (INDEPENDENT_AMBULATORY_CARE_PROVIDER_SITE_OTHER): Admitting: Medical

## 2023-06-09 ENCOUNTER — Other Ambulatory Visit: Payer: Self-pay

## 2023-06-09 ENCOUNTER — Encounter: Payer: Self-pay | Admitting: Medical

## 2023-06-09 VITALS — BP 122/70 | HR 65 | Wt 154.4 lb

## 2023-06-09 DIAGNOSIS — R682 Dry mouth, unspecified: Secondary | ICD-10-CM | POA: Diagnosis not present

## 2023-06-09 DIAGNOSIS — E1165 Type 2 diabetes mellitus with hyperglycemia: Secondary | ICD-10-CM

## 2023-06-09 DIAGNOSIS — M1710 Unilateral primary osteoarthritis, unspecified knee: Secondary | ICD-10-CM

## 2023-06-09 DIAGNOSIS — K219 Gastro-esophageal reflux disease without esophagitis: Secondary | ICD-10-CM

## 2023-06-09 DIAGNOSIS — E782 Mixed hyperlipidemia: Secondary | ICD-10-CM | POA: Diagnosis not present

## 2023-06-09 DIAGNOSIS — R110 Nausea: Secondary | ICD-10-CM

## 2023-06-09 DIAGNOSIS — I10 Essential (primary) hypertension: Secondary | ICD-10-CM

## 2023-06-09 DIAGNOSIS — T50905A Adverse effect of unspecified drugs, medicaments and biological substances, initial encounter: Secondary | ICD-10-CM | POA: Diagnosis not present

## 2023-06-09 MED ORDER — HYDROCHLOROTHIAZIDE 25 MG PO TABS
12.5000 mg | ORAL_TABLET | Freq: Every day | ORAL | 0 refills | Status: AC
Start: 2023-06-09 — End: ?

## 2023-06-09 MED ORDER — FAMOTIDINE 40 MG PO TABS: 40.0000 mg | ORAL_TABLET | Freq: Every evening | ORAL | 2 refills | Status: DC

## 2023-06-09 MED ORDER — CELECOXIB 100 MG PO CAPS: 100.0000 mg | ORAL_CAPSULE | Freq: Every day | ORAL | 1 refills | Status: DC | PRN

## 2023-06-09 NOTE — Progress Notes (Signed)
 Subjective:  Cassandra Hendricks is a 73 y.o. female who presents for Chief Complaint  Patient presents with   Diabetes    Ozempic  questions, wants to ask about being prescribed celebrex      Here for med check.    I saw her in March for med check.  At that time we made some changes with her medicaiton as she was having dry mouth.  Last visit we discontinued Rybelsus  and changed to ozempic .   We also  changed hydrochlorothiazide  to 1/2 tablet daily of 25mg  or 12.5mg  daily  She has completed 0.25mg  and 0.5mg  ozmpic and is now on her first dose of 1mg  ozempic .  She is not longer having dry mouth but is having nausea quite a bit, some less stool.  Usually has BM BID but now having BM once daily.  She would like me to start taking over Celebrex .  Was getting this through orthopedics, but they don't need to see her back and wants primary care to take this over.   She has several questions about Ozempic  side effects including issues she is having, nausea, GERD, changes in the stool.  Taking iron as well once daily, alt 1 and 1/2 tablet  She would like to get down to 150 pounds.  She is having issues with her lancets.  Sometimes they do not poke enough to get blood.  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  Saw hematology about 4 years ago here and saw hematology in [place]  in the past as well  No other aggravating or relieving factors.    No other c/o.  Past Medical History:  Diagnosis Date   Chronic neck pain    per med record from [place]   Female bladder prolapse    Herpes labialis    History of prediabetes 11/21/2015   Hemoglobin A1c 5.9% 05/10/2015. Prior to that 6.1% on 12/21/2014   History of thrombocytopenia    HTN (hypertension)    controlled with HCTZ and metoprolol , DASH. EKG 05/2015 NSR, no abnormalities   Mixed hyperlipidemia    per records from 05/2015 in [place]. diet and exercise   Osteopenia determined by x-ray    DEXA 05/30/2014 in [place]     Stress  incontinence    Current Outpatient Medications on File Prior to Visit  Medication Sig Dispense Refill   Accu-Chek Softclix Lancets lancets 3 (three) times daily.     Ascorbic Acid (VITAMIN C PO) Take by mouth.     Blood Glucose Monitoring Suppl (ACCU-CHEK GUIDE ME) w/Device KIT USE AS DIRECTED IN THE MORNING AND AT NOON AND AT BEDTIME     Blood Glucose Monitoring Suppl DEVI 1 each by Does not apply route in the morning, at noon, and at bedtime. May substitute to any manufacturer covered by patient's insurance. 1 each 0   Calcium  Carbonate (CALCIUM  500 PO) Take 1 tablet by mouth daily.     celecoxib  (CELEBREX ) 100 MG capsule Take 100 mg by mouth daily as needed for moderate pain.     ezetimibe  (ZETIA ) 10 MG tablet Take 1 tablet (10 mg total) by mouth daily. 90 tablet 2   famotidine  (PEPCID ) 40 MG tablet Take 1 tablet (40 mg total) by mouth every evening. 30 tablet 0   glucosamine-chondroitin 500-400 MG tablet Take 1 tablet by mouth 3 (three) times daily.     hydrochlorothiazide  (HYDRODIURIL ) 25 MG tablet Take 0.5 tablets (12.5 mg total) by mouth daily. (Patient taking differently: Take 25 mg by mouth daily.) 45  tablet 1   metoprolol  tartrate (LOPRESSOR ) 50 MG tablet Take 1 tablet (50 mg total) by mouth 2 (two) times daily. 180 tablet 2   Multiple Vitamin (MULTIVITAMIN) tablet Take 1 tablet by mouth daily.     rosuvastatin  (CRESTOR ) 20 MG tablet Take 1 tablet (20 mg total) by mouth daily. 90 tablet 2   Semaglutide , 1 MG/DOSE, 4 MG/3ML SOPN Inject 1 mg into the skin once a week. 3 mL 0   valACYclovir  (VALTREX ) 500 MG tablet Take 1 tablet by mouth once daily 90 tablet 1   zinc gluconate 50 MG tablet Take 50 mg by mouth daily.     Semaglutide ,0.25 or 0.5MG /DOS, (OZEMPIC , 0.25 OR 0.5 MG/DOSE,) 2 MG/1.5ML SOPN Inject 0.25 mg into the skin once a week. (Patient not taking: Reported on 06/09/2023) 6 mL 0   Semaglutide ,0.25 or 0.5MG /DOS, (OZEMPIC , 0.25 OR 0.5 MG/DOSE,) 2 MG/1.5ML SOPN Inject 0.5 mg into the  skin once a week. (Patient not taking: Reported on 06/09/2023) 6 mL 0   No current facility-administered medications on file prior to visit.     The following portions of the patient's history were reviewed and updated as appropriate: allergies, current medications, past family history, past medical history, past social history, past surgical history and problem list.  ROS Otherwise as in subjective above    Objective: BP 122/70   Pulse 65   Wt 154 lb 6.4 oz (70 kg)   SpO2 98%   BMI 28.24 kg/m   Wt Readings from Last 3 Encounters:  06/09/23 154 lb 6.4 oz (70 kg)  03/24/23 158 lb (71.7 kg)  02/04/23 162 lb (73.5 kg)   BP Readings from Last 3 Encounters:  06/09/23 122/70  03/24/23 120/70  12/19/22 122/72    General appearance: alert, no distress, well developed, well nourished Otherwise not examined     Assessment: Encounter Diagnoses  Name Primary?   Type 2 diabetes mellitus with hyperglycemia, unspecified whether long term insulin use (HCC) Yes   Mixed hyperlipidemia    Dry mouth    Medication adverse effect, initial encounter    Primary hypertension    Nausea    Gastroesophageal reflux disease, unspecified whether esophagitis present       Plan: Nausea, GERD-we discussed that these are likely side effects from her Ozempic .  She will use Emetrol over-the-counter for nausea.  She will use famotidine  for GERD and avoid GERD triggers as discussed.  If the symptoms continue we may have to stop Ozempic  and change to something else  Diabetes associated with hyperlipidemia and hypertension-her sugar numbers are at goal.  She did not tolerate Rybelsus  due to dry mouth which resolved after stopping Rybelsus .  She is not tolerating Ozempic  all that well either due to nausea and GERD but she is willing to give a little more time.  She will let me know in the next month if she is tolerating this or not at the 1 mg dose.  If not tolerating it we will either change to Mounjaro  or go down on the dose back to 0.5 mg Ozempic .  I advise she use the Ozempic  maybe on Wednesday of the week instead of Saturday or Sunday which is when she tends to have heavier meals.  We discussed came back on portion sizes in general.  Avoiding GERD triggers.  Thrombocytopenia-I reviewed back in 2021 notes from hematology.  She was diagnosed with ITP.  No other specific cause found.  Based on her ultrasound last year of her  abdomen she has fatty liver disease which could be a source of low platelets over time.  She is also on long long-term chronic preventative Valtrex  which has also been linked to low platelets.  She uses this to help prevent frequent cold sores.  Fatty liver on prior scans-we discussed the rationale for using Ozempic  to help with mildly diabetes control for weight loss and fatty liver disease.  Hyperlipidemia-labs today, continue Crestor  20 mg daily and Zetia  10 mg daily  Hypertension-continue metoprolol  50 mg twice daily and hydrochlorothiazide  25 mg, 1/2 tablet daily.  Dry mouth-resolved after stopping Ozempic   Arthritis of knee-I will take over her Celebrex  prescription for now but advised risk and benefits..  Discussed long-term risk of this and other NSAIDs  Letonia was seen today for diabetes.  Diagnoses and all orders for this visit:  Type 2 diabetes mellitus with hyperglycemia, unspecified whether long term insulin use (HCC)  Mixed hyperlipidemia  Dry mouth  Medication adverse effect, initial encounter  Primary hypertension  Nausea  Gastroesophageal reflux disease, unspecified whether esophagitis present  Spent > 30 minutes face to face with patient in discussion of symptoms, evaluation, plan and recommendations.    Follow up: Call report within the next month

## 2023-06-11 ENCOUNTER — Telehealth: Payer: Self-pay | Admitting: Medical

## 2023-06-11 NOTE — Telephone Encounter (Signed)
 Pt called re Ozempic   At her appt Cassandra Hendricks told her to change to taking on Wednesdays instead of Saturday or Sundays because that is when she has her biggest meals.  She took her last ozempic  dose on Saturday 5/31 and wanted to see if she should start this Wednesday or wait until next Wednesday and per Cassandra Hendricks advised her to wait until next Wednesday

## 2023-06-18 ENCOUNTER — Other Ambulatory Visit: Payer: Self-pay | Admitting: Medical

## 2023-06-18 MED ORDER — ACCU-CHEK SOFTCLIX LANCETS MISC: 1 refills | Status: AC

## 2023-06-18 NOTE — Telephone Encounter (Signed)
 Copied from CRM [id number]. Topic: Clinical - Medication Refill >> Jun 18, 2023 10:18 AM Everlene Hobby D wrote: Medication: Accu-Chek Softclix Lancets lancets    Has the patient contacted their pharmacy? Yes (Agent: If no, request that the patient contact the pharmacy for the refill. If patient does not wish to contact the pharmacy document the reason why and proceed with request.) (Agent: If yes, when and what did the pharmacy advise?)  This is the patient's preferred pharmacy:  [id number] - [place] - [address] [place] ([place]) [place] [address] Phone: [phone] Fax: [phone]    Is this the correct pharmacy for this prescription? Yes If no, delete pharmacy and type the correct one.   Has the prescription been filled recently? No  Is the patient out of the medication? Yes  Has the patient been seen for an appointment in the last year OR does the patient have an upcoming appointment? Yes  Can we respond through MyChart? Yes  Agent: Please be advised that Rx refills may take up to 3 business days. We ask that you follow-up with your pharmacy.

## 2023-06-19 ENCOUNTER — Other Ambulatory Visit: Payer: Self-pay | Admitting: Medical

## 2023-06-19 DIAGNOSIS — B001 Herpesviral vesicular dermatitis: Secondary | ICD-10-CM

## 2023-06-25 ENCOUNTER — Other Ambulatory Visit: Payer: Self-pay

## 2023-06-25 DIAGNOSIS — E1165 Type 2 diabetes mellitus with hyperglycemia: Secondary | ICD-10-CM

## 2023-06-25 MED ORDER — GLUCOSE BLOOD VI STRP: ORAL_STRIP | 12 refills | Status: AC

## 2023-06-27 ENCOUNTER — Other Ambulatory Visit: Payer: Self-pay | Admitting: Medical

## 2023-06-30 ENCOUNTER — Other Ambulatory Visit: Payer: Self-pay | Admitting: Medical

## 2023-06-30 ENCOUNTER — Telehealth: Payer: Self-pay

## 2023-06-30 MED ORDER — OZEMPIC (0.25 OR 0.5 MG/DOSE) 2 MG/1.5ML ~~LOC~~ SOPN: 0.5000 mg | PEN_INJECTOR | SUBCUTANEOUS | 2 refills | Status: DC

## 2023-06-30 NOTE — Telephone Encounter (Signed)
 Copied from CRM [id number]. Topic: Clinical - Medication Question >> Jun 30, 2023 10:52 AM Cassandra Hendricks wrote: Reason for CRM: pt called in to ask Dr.Tysinger if he can go ahead and approve the refill for her Ozempic  . She also wanted to let him know that she still having nausea symptoms not to much because the meds she takes for the nausea is working . But she said if he needs to lower the dose to 0.5 she is okay with that . She needs to dose by this Wednesday if possible.

## 2023-07-01 ENCOUNTER — Telehealth: Payer: Self-pay

## 2023-07-01 NOTE — Telephone Encounter (Signed)
 Copied from CRM [id number]. Topic: Clinical - Medication Question >> Jun 30, 2023 11:36 AM Berwyn MATSU wrote: Reason for CRM: Patient called in to advise that she called retail pharmacy and was advised that ozempic  has been ordered and she will pick it up from pharmacy.

## 2023-07-24 ENCOUNTER — Ambulatory Visit: Admitting: Podiatry

## 2023-07-31 ENCOUNTER — Encounter: Payer: Self-pay | Admitting: Podiatry

## 2023-07-31 ENCOUNTER — Ambulatory Visit: Admitting: Podiatry

## 2023-07-31 VITALS — Ht 62.0 in | Wt 154.4 lb

## 2023-07-31 DIAGNOSIS — M24576 Contracture, unspecified foot: Secondary | ICD-10-CM

## 2023-07-31 DIAGNOSIS — M24574 Contracture, right foot: Secondary | ICD-10-CM | POA: Diagnosis not present

## 2023-07-31 NOTE — Patient Instructions (Signed)
 Leave bandage in place and dry for 4 days, then remove. You may wash foot normally after removal of bandage. DO NOT SOAK FOOT! Dry completely afterwards and may use a bandaid over incision if needed. We will follow up with you in 1 weeks for recheck.

## 2023-08-03 NOTE — Progress Notes (Signed)
 She is here today for a follow-up of her Lamisil .  She states that there is no difference that she can see to toenails.  She is also complains of pain to the right second toe states he usually receives an injection in the toe due to pain.  Objective: Vital signs are stable alert oriented x 3 there is no change in the nails as of yet.  Pulses are palpable.  She does have flexible dorsal contracture deformity resulting in irritation of the PIPJ second right.  Assessment: Flexible hammertoe deformity and onychomycosis.  Plan: Did not renew the Lamisil  therapy.  We did however perform a extensor tenotomy second right after local anesthetic was administered.  She tolerated procedure well without complications.  The area was cleaned with alcohol and then Dermabond was applied.  Dressed a compressive dressing was applied she was able to get back into her regular shoe.

## 2023-08-19 ENCOUNTER — Ambulatory Visit (INDEPENDENT_AMBULATORY_CARE_PROVIDER_SITE_OTHER): Admitting: Podiatry

## 2023-08-19 ENCOUNTER — Encounter: Payer: Self-pay | Admitting: Podiatry

## 2023-08-19 DIAGNOSIS — M79676 Pain in unspecified toe(s): Secondary | ICD-10-CM | POA: Diagnosis not present

## 2023-08-19 DIAGNOSIS — B351 Tinea unguium: Secondary | ICD-10-CM

## 2023-08-19 MED ORDER — TERBINAFINE HCL 250 MG PO TABS: 250.0000 mg | ORAL_TABLET | Freq: Every day | ORAL | 0 refills | Status: DC

## 2023-08-19 NOTE — Progress Notes (Signed)
 She presents today for follow-up of her tenotomy states that is still not playing into the second digit of the right foot and she has some burning and tingling in the toe.  She is also talking about her toenails thinks that she needs another round of Lamisil .  Objective: Vital signs stable oriented x 3 pulses are palpable.  Her second digit of the right foot is laying just about his rectus is a swelling gets specially secondary to the mild flexor contraction at the PIPJ which is arthritic.  The incision site is mildly tender on palpation.  Toenails are long thick yellow dystrophic still has some mycosis distally.  Plan: At this point I am going to start her back on Lamisil  250 mg tablets.  She will take 1 tablet daily.  And I debrided her nails 1 through 5 bilateral covered service.  I will see her back in 2 months for Lamisil  check

## 2023-09-21 ENCOUNTER — Other Ambulatory Visit: Payer: Self-pay | Admitting: Medical

## 2023-09-21 DIAGNOSIS — B001 Herpesviral vesicular dermatitis: Secondary | ICD-10-CM

## 2023-10-07 ENCOUNTER — Ambulatory Visit: Admitting: Medical

## 2023-10-07 ENCOUNTER — Other Ambulatory Visit (INDEPENDENT_AMBULATORY_CARE_PROVIDER_SITE_OTHER)

## 2023-10-07 DIAGNOSIS — Z23 Encounter for immunization: Secondary | ICD-10-CM | POA: Diagnosis not present

## 2023-10-08 ENCOUNTER — Telehealth: Payer: Self-pay

## 2023-10-08 NOTE — Telephone Encounter (Signed)
 Called pt and let her know she can use 2 doses of 0.5 mg per week, then she can use the 1 mg she has. She has 2 boxes of 0.5mg . She has 1 box of the 1 mg. So she has enough for 2 months if she stays on 1 mg.

## 2023-10-08 NOTE — Telephone Encounter (Signed)
 Copied from CRM #[id number]. Topic: Clinical - Medical Advice >> Oct 08, 2023 11:35 AM Wess RAMAN wrote: Reason for CRM: Patient is on ozempic  and 0.5 MG is not working. Patient is always hungry and is eating things she should not be eating. Patient has a full box and was advised to take 2 doses. She would like to know what Cassandra Hendricks thinks  Callback #: [phone]

## 2023-10-10 ENCOUNTER — Telehealth: Payer: Self-pay

## 2023-10-10 NOTE — Telephone Encounter (Signed)
 Patient called and would like to be moved up to next dose on Ozempic , due to eating uncontrollably. Thank you

## 2023-10-13 NOTE — Telephone Encounter (Signed)
 She has one scheduled for 12/15/23, was scheduled back in June.

## 2023-10-14 ENCOUNTER — Other Ambulatory Visit: Payer: Self-pay | Admitting: Medical

## 2023-10-23 ENCOUNTER — Ambulatory Visit: Admitting: Podiatry

## 2023-10-23 ENCOUNTER — Encounter: Payer: Self-pay | Admitting: Podiatry

## 2023-10-23 DIAGNOSIS — B351 Tinea unguium: Secondary | ICD-10-CM

## 2023-10-23 DIAGNOSIS — M79676 Pain in unspecified toe(s): Secondary | ICD-10-CM | POA: Diagnosis not present

## 2023-10-23 MED ORDER — TERBINAFINE HCL 250 MG PO TABS: 250.0000 mg | ORAL_TABLET | Freq: Every day | ORAL | 0 refills | Status: DC

## 2023-10-23 NOTE — Progress Notes (Signed)
 Presents today for follow-up of her nail fungus she is completed another 30 days of Lamisil .  She states this has been doing pretty good but like to have her nails cut if possible.  Objective: Vital signs are stable alert oriented x 3.  Onychomycosis appears to be growing out by approximately 75% at this point in time she has onychocryptosis and long thick dystrophic nails as well as mycotic toward the end.  Assessment: Pain in limb secondary to onychomycosis long-term therapy with Lamisil  for onychomycosis debridement of toenails 1 through 5 bilateral covered service secondary to pain.

## 2023-10-27 ENCOUNTER — Other Ambulatory Visit (INDEPENDENT_AMBULATORY_CARE_PROVIDER_SITE_OTHER)

## 2023-10-27 DIAGNOSIS — Z23 Encounter for immunization: Secondary | ICD-10-CM | POA: Diagnosis not present

## 2023-11-27 ENCOUNTER — Other Ambulatory Visit: Payer: Self-pay | Admitting: Nurse Practitioner

## 2023-11-27 ENCOUNTER — Other Ambulatory Visit: Payer: Self-pay | Admitting: Medical

## 2023-11-27 NOTE — Telephone Encounter (Signed)
 Last appt. 06/09/23 not on this mg in chart

## 2023-11-27 NOTE — Telephone Encounter (Signed)
 Copied from CRM #[id number]. Topic: Clinical - Medication Refill >> Nov 27, 2023 12:52 PM Deaijah H wrote: Medication: Semaglutide ,0.25 or 0.5MG /DOS, (OZEMPIC , 0.25 OR 0.5 MG/DOSE,) 2 MG/1.5ML SOPN  Has the patient contacted their pharmacy? No (Agent: If no, request that the patient contact the pharmacy for the refill. If patient does not wish to contact the pharmacy document the reason why and proceed with request.) Because script is old from June  (Agent: If yes, when and what did the pharmacy advise?)  This is the patient's preferred pharmacy:  [id number] - [place] ([place]), [place] - [address] [place] ([place]) [place] [address] Phone: [phone] Fax: [phone]   Is this the correct pharmacy for this prescription? Yes If no, delete pharmacy and type the correct one.   Has the prescription been filled recently? No  Is the patient out of the medication? No, 1 dose left need before leaving out of town on 11/25  Has the patient been seen for an appointment in the last year OR does the patient have an upcoming appointment? Yes  Can we respond through MyChart? Yes  Agent: Please be advised that Rx refills may take up to 3 business days. We ask that you follow-up with your pharmacy.

## 2023-12-09 ENCOUNTER — Ambulatory Visit: Admitting: Podiatry

## 2023-12-11 NOTE — Telephone Encounter (Signed)
 She has 0.5mg  left and will double up to the 1mg  and follow-up on the 12/15 and we can straighten out to the correct dose of what Ludie recommends  Copied from CRM #[id number]. Topic: Clinical - Medication Question >> Dec 11, 2023 10:16 AM Cassandra Hendricks wrote: Reason for CRM: Patient stated that she took a double a dose of Semaglutide ,0.25 or 0.5MG /DOS, (OZEMPIC , 0.25 OR 0.5 MG/DOSE,) 2 MG/1.5ML SOPN  because when she picked up her script from the pharmacy it wasn't upgraded to the 1mg . Patient requesting a call back with instruction on what she should do

## 2023-12-15 ENCOUNTER — Encounter: Payer: Self-pay | Admitting: Medical

## 2023-12-16 ENCOUNTER — Ambulatory Visit: Admitting: Podiatry

## 2023-12-21 ENCOUNTER — Other Ambulatory Visit: Payer: Self-pay | Admitting: Medical

## 2023-12-21 DIAGNOSIS — B001 Herpesviral vesicular dermatitis: Secondary | ICD-10-CM

## 2023-12-22 ENCOUNTER — Ambulatory Visit: Admitting: Medical

## 2023-12-22 ENCOUNTER — Encounter: Payer: Self-pay | Admitting: Medical

## 2023-12-22 VITALS — BP 124/80 | HR 68 | Wt 149.8 lb

## 2023-12-22 DIAGNOSIS — E782 Mixed hyperlipidemia: Secondary | ICD-10-CM | POA: Diagnosis not present

## 2023-12-22 DIAGNOSIS — E1165 Type 2 diabetes mellitus with hyperglycemia: Secondary | ICD-10-CM

## 2023-12-22 DIAGNOSIS — B001 Herpesviral vesicular dermatitis: Secondary | ICD-10-CM | POA: Diagnosis not present

## 2023-12-22 DIAGNOSIS — M170 Bilateral primary osteoarthritis of knee: Secondary | ICD-10-CM | POA: Insufficient documentation

## 2023-12-22 DIAGNOSIS — M25562 Pain in left knee: Secondary | ICD-10-CM | POA: Diagnosis not present

## 2023-12-22 DIAGNOSIS — M25561 Pain in right knee: Secondary | ICD-10-CM

## 2023-12-22 DIAGNOSIS — I10 Essential (primary) hypertension: Secondary | ICD-10-CM | POA: Diagnosis not present

## 2023-12-22 DIAGNOSIS — G8929 Other chronic pain: Secondary | ICD-10-CM

## 2023-12-22 DIAGNOSIS — I700 Atherosclerosis of aorta: Secondary | ICD-10-CM

## 2023-12-22 LAB — POCT GLYCOSYLATED HEMOGLOBIN (HGB A1C): Hemoglobin A1C: 5.9 % — AB (ref 4.0–5.6)

## 2023-12-22 MED ORDER — BETAMETHASONE DIPROPIONATE AUG 0.05 % EX LOTN: TOPICAL_LOTION | Freq: Two times a day (BID) | CUTANEOUS | 0 refills | Status: AC

## 2023-12-22 MED ORDER — METOPROLOL TARTRATE 50 MG PO TABS: 50.0000 mg | ORAL_TABLET | Freq: Two times a day (BID) | ORAL | 2 refills | Status: DC

## 2023-12-22 MED ORDER — HYDROCHLOROTHIAZIDE 25 MG PO TABS: 12.5000 mg | ORAL_TABLET | Freq: Every day | ORAL | 3 refills | Status: AC

## 2023-12-22 MED ORDER — ROSUVASTATIN CALCIUM 20 MG PO TABS: 20.0000 mg | ORAL_TABLET | Freq: Every day | ORAL | 3 refills | Status: AC

## 2023-12-22 MED ORDER — EZETIMIBE 10 MG PO TABS: 10.0000 mg | ORAL_TABLET | Freq: Every day | ORAL | 3 refills | Status: AC

## 2023-12-22 MED ORDER — CELECOXIB 100 MG PO CAPS: 100.0000 mg | ORAL_CAPSULE | Freq: Every day | ORAL | 1 refills | Status: AC | PRN

## 2023-12-22 MED ORDER — FAMOTIDINE 40 MG PO TABS: 40.0000 mg | ORAL_TABLET | Freq: Every evening | ORAL | 3 refills | Status: AC

## 2023-12-22 MED ORDER — VALACYCLOVIR HCL 500 MG PO TABS: 500.0000 mg | ORAL_TABLET | Freq: Every day | ORAL | 3 refills | Status: AC

## 2023-12-22 NOTE — Progress Notes (Signed)
 requested

## 2023-12-22 NOTE — Progress Notes (Unsigned)
 Subjective: Chief Complaint  Patient presents with   Medical Management of Chronic Issues    Med check, had one last March eye exam, Dr Fate,    History of Present Illness Cassandra Hendricks is a 73 year old female with diabetes and hypertension who presents for a medication check.  She is currently on Zetia  10 mg daily and Crestor  20 mg daily for cholesterol management, metoprolol  50 mg twice daily and hydrochlorothiazide  12.5 mg daily for hypertension, and Ozempic  1 mg weekly for diabetes. There is a discrepancy in her Ozempic  prescription, as it was renewed at 0.5 mg but she has been taking 1 mg for the past two to three months, which causes nausea. She takes an unspecified medication for nausea as needed.  Her blood sugar levels are well-controlled, with a recent low of 95 and a post-cheat day level of 100. She checks her blood sugar once a week, with the highest recent reading being 135.  She has a history of an itchy, flaky scalp, for which she uses a liquid medication prescribed by a dermatologist in 2020. Stress, such as from a recent root canal, exacerbates the condition. She requests a refill of this medication.  She takes Celebrex  for arthritis pain, primarily in her knees, and has not seen an orthopedic specialist for a couple of years. She previously received a knee injection from Dr. Orpha.  She experiences GERD symptoms, which she attributes to Ozempic . She takes famotidine  daily to manage these symptoms. Bending over often causes acid to rise into her throat, sometimes resulting in regurgitation and burning.  She takes valacyclovir  daily for prevention of herpes or shingles outbreaks, noting that stress can trigger episodes. She recently experienced an outbreak during a period of high stress.  No other aggravating or relieving factors. No other complaint.   Past Medical History:  Diagnosis Date   Chronic neck pain    per med record from [place]   Female bladder prolapse     Herpes labialis    History of prediabetes 11/21/2015   Hemoglobin A1c 5.9% 05/10/2015. Prior to that 6.1% on 12/21/2014   History of thrombocytopenia    HTN (hypertension)    controlled with HCTZ and metoprolol , DASH. EKG 05/2015 NSR, no abnormalities   Mixed hyperlipidemia    per records from 05/2015 in [place]. diet and exercise   Osteopenia determined by x-ray    DEXA 05/30/2014 in [place]     Stress incontinence    Current Outpatient Medications on File Prior to Visit  Medication Sig Dispense Refill   Accu-Chek Softclix Lancets lancets Test3 (three) times daily. 200 each 1   Ascorbic Acid (VITAMIN C PO) Take by mouth.     betamethasone , augmented, (DIPROLENE ) 0.05 % lotion Apply topically 2 (two) times daily.     Blood Glucose Monitoring Suppl (ACCU-CHEK GUIDE ME) w/Device KIT USE AS DIRECTED IN THE MORNING AND AT NOON AND AT BEDTIME     Blood Glucose Monitoring Suppl DEVI 1 each by Does not apply route in the morning, at noon, and at bedtime. May substitute to any manufacturer covered by patient's insurance. 1 each 0   Calcium  Carbonate (CALCIUM  500 PO) Take 1 tablet by mouth daily.     glucosamine-chondroitin 500-400 MG tablet Take 1 tablet by mouth 3 (three) times daily.     glucose blood test strip Use as instructed 100 each 12   Multiple Vitamin (MULTIVITAMIN) tablet Take 1 tablet by mouth daily.     zinc gluconate 50 MG  tablet Take 50 mg by mouth daily.     No current facility-administered medications on file prior to visit.    ROS as in subjective   Objective: BP 124/80   Pulse 68   Wt 149 lb 12.8 oz (67.9 kg)   BMI 27.40 kg/m   General appearence: alert, no distress, WD/WN,  Neck: supple, no lymphadenopathy, no thyromegaly, no masses Heart: RRR, normal S1, S2, no murmurs Lungs: CTA bilaterally, no wheezes, rhonchi, or rales Pulses: 2+ symmetric, upper and lower extremities, normal cap refill      Assessment and Plan Encounter Diagnoses  Name Primary?    Type 2 diabetes mellitus with hyperglycemia, unspecified whether long term insulin use (HCC) Yes   Herpes labialis    Primary hypertension    Aortic atherosclerosis    Mixed hyperlipidemia    Recurrent cold sores    Chronic pain of both knees    Osteoarthritis of both knees, unspecified osteoarthritis type     Type 2 diabetes mellitus Well-controlled with stable blood sugar levels. Experiences nausea and GERD with Ozempic . - Ordered A1c test. - Continue Ozempic  weekly, but change down to 0.5 mg due to nausea and GI side effects.  For urine completely out of this medicine in 2 months let me know how you are doing in case we want to either continue at the 0.5 mg Ozempic  or increase to 1 mg Ozempic  and possibly go every 10 to 12 days instead of weekly or change to Mounjaro 5 mg as another option - Monitor blood sugar levels regularly. - Discuss potential switch to Mounjaro if side effects continue.  Primary hypertension - Continue metoprolol  50 mg twice daily. - Continue hydrochlorothiazide  12.5 mg daily.  Osteoarthritis of knee Managed with Celebrex , concerns about long-term kidney effects. No recent specialist evaluation. - Consider alternative pain management options such as Tylenol or topical creams. - Consider follow-up here with  Dr. [facility]ita sports medicine specialist for evaluation and potential updated treatment.  Gastroesophageal reflux disease Managed with famotidine . - Continue famotidine  daily.  Herpes labialis Managed with daily valacyclovir . - Continue valacyclovir  daily for prevention.  Seborrheic dermatitis of scalp Managed with topical liquid, flare-ups during stress. - Renewed prescription for topical liquid for scalp. - Advised against daily use to prevent skin thinning.  General health maintenance - Ordered microalbumin urine test. - Obtain copy of recent eye exam from Dr. Octavia. - Ensure mammogram is completed in April.   Cassandra Hendricks was seen today for medical  management of chronic issues.  Diagnoses and all orders for this visit:  Type 2 diabetes mellitus with hyperglycemia, unspecified whether long term insulin use (HCC) -     POCT glycosylated hemoglobin (Hb A1C) -     Microalbumin/Creatinine Ratio, Urine  Herpes labialis -     valACYclovir  (VALTREX ) 500 MG tablet; Take 1 tablet (500 mg total) by mouth daily.  Primary hypertension -     hydrochlorothiazide  (HYDRODIURIL ) 25 MG tablet; Take 0.5 tablets (12.5 mg total) by mouth daily. -     metoprolol  tartrate (LOPRESSOR ) 50 MG tablet; Take 1 tablet (50 mg total) by mouth 2 (two) times daily.  Aortic atherosclerosis  Mixed hyperlipidemia  Recurrent cold sores  Chronic pain of both knees  Osteoarthritis of both knees, unspecified osteoarthritis type  Other orders -     celecoxib  (CELEBREX ) 100 MG capsule; Take 1 capsule (100 mg total) by mouth daily as needed for moderate pain (pain score 4-6). -     betamethasone , augmented, (DIPROLENE )  0.05 % lotion; Apply topically 2 (two) times daily. -     rosuvastatin  (CRESTOR ) 20 MG tablet; Take 1 tablet (20 mg total) by mouth daily. -     ezetimibe  (ZETIA ) 10 MG tablet; Take 1 tablet (10 mg total) by mouth daily. -     famotidine  (PEPCID ) 40 MG tablet; Take 1 tablet (40 mg total) by mouth every evening.   F/u 03/2024 for well visit

## 2023-12-23 ENCOUNTER — Ambulatory Visit: Payer: Self-pay | Admitting: Medical

## 2023-12-23 LAB — MICROALBUMIN / CREATININE URINE RATIO
Creatinine, Urine: 37.8 mg/dL
Microalb/Creat Ratio: <8 mg/g{creat} (ref 0–29)
Microalbumin, Urine: <3 ug/mL

## 2023-12-23 NOTE — Progress Notes (Signed)
 Results thru my chart

## 2023-12-25 ENCOUNTER — Ambulatory Visit: Admitting: Podiatry

## 2023-12-25 ENCOUNTER — Encounter: Payer: Self-pay | Admitting: Podiatry

## 2023-12-25 DIAGNOSIS — M79676 Pain in unspecified toe(s): Secondary | ICD-10-CM | POA: Diagnosis not present

## 2023-12-25 DIAGNOSIS — B351 Tinea unguium: Secondary | ICD-10-CM | POA: Diagnosis not present

## 2023-12-25 DIAGNOSIS — M24576 Contracture, unspecified foot: Secondary | ICD-10-CM

## 2023-12-27 NOTE — Progress Notes (Signed)
 She states that she has completed her Lamisil  sometimes before Thanksgiving states that the toes on the left foot look good the ones on the right foot are still growing out some.  She is requesting a nail trim today.  Objective: Vital signs are stable alert and oriented x 3.  Still has some residual onychomycosis right foot.  Left foot appears to be doing very well.  No skin lesion or rashes noted.  Assessment pain in limb secondary to onychomycosis long-term therapy with Lamisil .  Plan: Debridement of toenails 1 through 5 bilateral.

## 2024-01-06 ENCOUNTER — Ambulatory Visit (INDEPENDENT_AMBULATORY_CARE_PROVIDER_SITE_OTHER): Admitting: Medical

## 2024-01-06 VITALS — BP 120/80 | HR 63 | Temp 98.7°F | Resp 16 | Wt 153.6 lb

## 2024-01-06 DIAGNOSIS — J988 Other specified respiratory disorders: Secondary | ICD-10-CM

## 2024-01-06 DIAGNOSIS — H9209 Otalgia, unspecified ear: Secondary | ICD-10-CM | POA: Diagnosis not present

## 2024-01-06 DIAGNOSIS — J3489 Other specified disorders of nose and nasal sinuses: Secondary | ICD-10-CM

## 2024-01-06 LAB — POC COVID19/FLU A&B COMBO
Covid Antigen, POC: NEGATIVE
Influenza A Antigen, POC: NEGATIVE
Influenza B Antigen, POC: NEGATIVE

## 2024-01-06 MED ORDER — AMOXICILLIN 500 MG PO CAPS: 500.0000 mg | ORAL_CAPSULE | Freq: Three times a day (TID) | ORAL | 0 refills | Status: AC

## 2024-01-06 NOTE — Addendum Note (Signed)
 Addended by: VICCI HUSBAND A on: 01/06/2024 05:13 PM   Modules accepted: Orders

## 2024-01-06 NOTE — Patient Instructions (Signed)
 Symptoms currently suggest viral cold symptoms  You were negative for flu and covid  Recommendations: Consider either Coricidin HBP or Mucinex DM for mucous, congestion and cough Drink plenty of fluids throughout the day You can use Tylenol as needed for pain Continue chloraseptic for sore throat pain Use warm fluids such as hot tea or hot tea and lemon If worse ear pain, severe throat pain or worsening thickened mucous within 48 hours, then you can consider antibiotic given upcoming dental work

## 2024-01-06 NOTE — Progress Notes (Signed)
 "  Name: Cassandra Hendricks   Date of Visit: 01/06/2024   Date of last visit with me: 12/22/2023   CHIEF COMPLAINT:  Chief Complaint  Patient presents with   Acute Visit    Sore throat, runny nose, some cough, chills- once, headache. No body aches or fever, symptoms started Sunday. Been around family members that were sick last week Has a dentist appt on Monday that she needs to get too as she has been waiting 4 months for.        HPI:  Discussed the use of AI scribe software for clinical note transcription with the patient, who gave verbal consent to proceed.  History of Present Illness  Cassandra Hendricks is a 73 year old female who presents with symptoms of an upper respiratory infection.  Symptoms began on Sunday 3 days ago and include rhinorrhea, pharyngitis, cough, and mild cephalalgia. She also experiences aural discomfort, described as 'feeling funny' and 'stopping up'.  No myalgia, chills, dyspnea, or wheezing. Nasal discharge is yellow.  She uses Chloraseptic throat spray for relief, as she cannot gargle without swallowing. No other treatments like nasal saline flushes have been used.  Her sister-in-law, who had similar symptoms, was diagnosed with a respiratory infection and tested negative for influenza and COVID-19. Her sister-in-law is currently on antibiotics.  She has an upcoming dental appointment on Monday, which she has been waiting four months for, to address a temporary filling from a recent root canal and two temporary crowns.   No other aggravating or relieving factors. No other complaint.  Past Medical History:  Diagnosis Date   Chronic neck pain    per med record from [place]   Female bladder prolapse    Herpes labialis    History of prediabetes 11/21/2015   Hemoglobin A1c 5.9% 05/10/2015. Prior to that 6.1% on 12/21/2014   History of thrombocytopenia    HTN (hypertension)    controlled with HCTZ and metoprolol , DASH. EKG 05/2015 NSR, no  abnormalities   Mixed hyperlipidemia    per records from 05/2015 in [place]. diet and exercise   Osteopenia determined by x-ray    DEXA 05/30/2014 in [place]     Stress incontinence    Medications Ordered Prior to Encounter[1]     Objective: BP 120/80   Pulse 63   Temp 98.7 F (37.1 C)   Resp 16   Wt 153 lb 9.6 oz (69.7 kg)   SpO2 99%   BMI 28.09 kg/m   General appearence: alert, no distress, WD/WN,  HEENT: normocephalic, sclerae anicteric, TMs with slight erythema, nares patent, no discharge, mild erythema, pharynx normal Oral cavity: MMM, no lesions Neck: supple, no lymphadenopathy, no thyromegaly, no masses Heart: RRR, normal S1, S2, no murmurs Lungs: CTA bilaterally, no wheezes, rhonchi, or rales    Assessment: Encounter Diagnoses  Name Primary?   Otalgia, unspecified laterality Yes   Respiratory tract infection    Purulent nasal discharge     Plan: Symptoms currently suggest viral cold symptoms  Recommendations: Consider either Coricidin HBP or Mucinex DM for mucous, congestion and cough Drink plenty of fluids throughout the day You can use Tylenol as needed for pain Continue chloraseptic for sore throat pain Use warm fluids such as hot tea or hot tea and lemon If worse ear pain, severe throat pain or worsening thickened mucous within 48 hours, then you can consider antibiotic given upcoming dental work    Brelyn was seen today for acute visit.  Diagnoses and all orders  for this visit:  Otalgia, unspecified laterality  Respiratory tract infection  Purulent nasal discharge  Other orders -     amoxicillin  (AMOXIL ) 500 MG capsule; Take 1 capsule (500 mg total) by mouth 3 (three) times daily for 10 days.    F/u prn       [1]  Current Outpatient Medications on File Prior to Visit  Medication Sig Dispense Refill   Accu-Chek Softclix Lancets lancets Test3 (three) times daily. 200 each 1   Ascorbic Acid (VITAMIN C PO) Take by mouth.      betamethasone , augmented, (DIPROLENE ) 0.05 % lotion Apply topically 2 (two) times daily.     Calcium  Carbonate (CALCIUM  500 PO) Take 1 tablet by mouth daily.     celecoxib  (CELEBREX ) 100 MG capsule Take 1 capsule (100 mg total) by mouth daily as needed for moderate pain (pain score 4-6). 90 capsule 1   ezetimibe  (ZETIA ) 10 MG tablet Take 1 tablet (10 mg total) by mouth daily. 90 tablet 3   famotidine  (PEPCID ) 40 MG tablet Take 1 tablet (40 mg total) by mouth every evening. 90 tablet 3   glucosamine-chondroitin 500-400 MG tablet Take 1 tablet by mouth 3 (three) times daily.     hydrochlorothiazide  (HYDRODIURIL ) 25 MG tablet Take 0.5 tablets (12.5 mg total) by mouth daily. 45 tablet 3   metoprolol  tartrate (LOPRESSOR ) 50 MG tablet Take 1 tablet (50 mg total) by mouth 2 (two) times daily. 180 tablet 2   Multiple Vitamin (MULTIVITAMIN) tablet Take 1 tablet by mouth daily.     rosuvastatin  (CRESTOR ) 20 MG tablet Take 1 tablet (20 mg total) by mouth daily. 90 tablet 3   valACYclovir  (VALTREX ) 500 MG tablet Take 1 tablet (500 mg total) by mouth daily. 90 tablet 3   zinc gluconate 50 MG tablet Take 50 mg by mouth daily.     betamethasone , augmented, (DIPROLENE ) 0.05 % lotion Apply topically 2 (two) times daily. 30 mL 0   Blood Glucose Monitoring Suppl (ACCU-CHEK GUIDE ME) w/Device KIT USE AS DIRECTED IN THE MORNING AND AT NOON AND AT BEDTIME     Blood Glucose Monitoring Suppl DEVI 1 each by Does not apply route in the morning, at noon, and at bedtime. May substitute to any manufacturer covered by patient's insurance. 1 each 0   glucose blood test strip Use as instructed 100 each 12   No current facility-administered medications on file prior to visit.   "

## 2024-02-04 ENCOUNTER — Telehealth: Payer: Self-pay | Admitting: Internal Medicine

## 2024-02-04 DIAGNOSIS — I10 Essential (primary) hypertension: Secondary | ICD-10-CM

## 2024-02-04 MED ORDER — OZEMPIC (0.25 OR 0.5 MG/DOSE) 2 MG/3ML ~~LOC~~ SOPN: PEN_INJECTOR | SUBCUTANEOUS | 1 refills | Status: AC

## 2024-02-04 MED ORDER — METOPROLOL TARTRATE 50 MG PO TABS: 50.0000 mg | ORAL_TABLET | Freq: Two times a day (BID) | ORAL | 1 refills | Status: AC

## 2024-02-04 NOTE — Telephone Encounter (Signed)
 Semaglutide ,0.25 or 0.5MG /DOS, (OZEMPIC  metoprolol  tartrate (LOPRESSOR ) 50 MG tablet  Pt states November was increased to 1 mg on ozempic , but the refill was sent in for 0.5 mg, patient doubled up on dosage so patient is short a dosage and is out..    Copied from CRM [id number]. Topic: Clinical - Medication Refill >> Feb 04, 2024  9:36 AM Roselie BROCKS wrote: Medication: Semaglutide ,0.25 or 0.5MG /DOS, (OZEMPIC  metoprolol  tartrate (LOPRESSOR ) 50 MG tablet  Pt states November was increased to 1 mg on ozempic , but the refill was sent in for 0.5 mg, patient doubled up on dosage so patient is short a dosage and is out.SABRA  Has the patient contacted their pharmacy? Yes (Agent: If no, request that the patient contact the pharmacy for the refill. If patient does not wish to contact the pharmacy document the reason why and proceed with request.) (Agent: If yes, when and what did the pharmacy advise?)  This is the patient's preferred pharmacy:  [id number] - [place] ([place]), [place] - [address] [place] ([place]) [place] [address] Phone: [phone] Fax: [phone]  Is this the correct pharmacy for this prescription? Yes If no, delete pharmacy and type the correct one.   Has the prescription been filled recently? No  Is the patient out of the medication? Yes  Has the patient been seen for an appointment in the last year OR does the patient have an upcoming appointment? Yes  Can we respond through MyChart? Yes  Agent: Please be advised that Rx refills may take up to 3 business days. We ask that you follow-up with your pharmacy.

## 2024-02-04 NOTE — Telephone Encounter (Signed)
 Spoke to patient. She was on 1mg  but it made her sick so she went back down to the 0.5mg . she wants to stay on this dose. She will follow-up in April.   I have sent 0.5mg  in for pt

## 2024-04-19 ENCOUNTER — Encounter: Admitting: Medical

## 2024-04-27 ENCOUNTER — Ambulatory Visit: Payer: Self-pay
